# Patient Record
Sex: Male | Born: 1960 | Race: White | Hispanic: No | Marital: Single | Smoking: Never smoker
Health system: Southern US, Community
[De-identification: ages and names within clinical notes are randomized; demographics above are authoritative.]

## PROBLEM LIST (undated history)

## (undated) DIAGNOSIS — R059 Cough, unspecified: Secondary | ICD-10-CM

## (undated) DIAGNOSIS — Z22322 Carrier or suspected carrier of Methicillin resistant Staphylococcus aureus: Secondary | ICD-10-CM

## (undated) DIAGNOSIS — K200 Eosinophilic esophagitis: Secondary | ICD-10-CM

## (undated) DIAGNOSIS — R05 Cough: Secondary | ICD-10-CM

## (undated) DIAGNOSIS — K222 Esophageal obstruction: Secondary | ICD-10-CM

## (undated) HISTORY — DX: Eosinophilic esophagitis: K20.0

## (undated) HISTORY — DX: Cough: R05

## (undated) HISTORY — DX: Cough, unspecified: R05.9

## (undated) HISTORY — DX: Esophageal obstruction: K22.2

## (undated) HISTORY — PX: NASAL POLYP EXCISION: SHX2068

## (undated) HISTORY — DX: Carrier or suspected carrier of methicillin resistant Staphylococcus aureus: Z22.322

---
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
## 2010-01-21 DIAGNOSIS — L0291 Cutaneous abscess, unspecified: Secondary | ICD-10-CM | POA: Insufficient documentation

## 2010-01-21 DIAGNOSIS — L0390 Cellulitis, unspecified: Secondary | ICD-10-CM

## 2010-02-07 ENCOUNTER — Encounter (INDEPENDENT_AMBULATORY_CARE_PROVIDER_SITE_OTHER): Payer: Self-pay | Admitting: *Deleted

## 2010-03-22 ENCOUNTER — Ambulatory Visit: Payer: Self-pay | Admitting: Internal Medicine

## 2010-03-22 DIAGNOSIS — L0293 Carbuncle, unspecified: Secondary | ICD-10-CM

## 2010-03-22 DIAGNOSIS — J309 Allergic rhinitis, unspecified: Secondary | ICD-10-CM | POA: Insufficient documentation

## 2010-03-22 DIAGNOSIS — L0292 Furuncle, unspecified: Secondary | ICD-10-CM | POA: Insufficient documentation

## 2010-03-25 ENCOUNTER — Encounter: Payer: Self-pay | Admitting: Infectious Diseases

## 2010-06-21 NOTE — Miscellaneous (Signed)
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 Added new problem of ALLERGIC RHINITIS CAUSE UNSPECIFIED (ICD-477.9) Medications: Added new medication of BACTRIM DS 800-160 MG TABS (SULFAMETHOXAZOLE-TRIMETHOPRIM) Take 2 tablets by mouth two times a day per Lovenia Kim, PA-C Added new medication of IBUPROFEN 200 MG TABS (IBUPROFEN) Take 1 tablet by mouth every 6 hours as needed per Lovenia Kim, PA-C Added new medication of CLARITIN 10 MG TABS (LORATADINE) Take 1 tablet by mouth once a day as needed per Lovenia Kim, PA-C Added new medication of BACTROBAN NASAL 2 % OINT (MUPIROCIN CALCIUM) Apply nasallly two times a day for 7 days Observations: Added new observation of NKA: T (02/07/2010 8:36)

## 2010-06-21 NOTE — Consult Note (Signed)
Summary: New Pt. Referral: [facility]  New Pt. Referral: [facility]   Imported By: Florinda Marker 03/25/2010 11:20:38  _____________________________________________________________________  External Attachment:    Type:   Image     Comment:   External Document

## 2010-06-21 NOTE — Assessment & Plan Note (Signed)
Summary: refrom 9/28 new pt. [mkj]   CC:  10 times in the past 2 years I have had MRSA.  Starts a "spider-bite" looking place and develops into a "head".  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 Dewaine Oats for evaluation of recurrent MRSA boils.  [name] states that he has generally been in good health until January of 2010 when he developed his first large boil.  He has had 9 separate recurrences since that time with large boils on his abdomen, left thigh, arms, axillae and face.  All have required treatment with oral antibiotics, usually Bactrim.  One and required incision and drainage.  Several have drained spontaneously and been cultured. Several cultures, including one from 9 to 11, grew MRSA.  In addition to antibiotic therapy and one incision and drainage procedure he has also tried bathing with chlorhexidine daily for the past 1 1/2 years, 3 separate rounds of intranasal mupirocin decolonization, and he says he went "overboard for a while" bathing in diluted bleach solutions to try to prevent recurrences.  His last boil occurred on his left forearm near his elbow in early September.  He was treated with Bactrim again but developed a pruritic red rash and was changed to doxycycline.  He has not had any recurrences since that time.  He continues today daily with chlorhexidine.  He admits to having problems with dry skin and suffers frequent cuts and scrapes in his work.  He lives with his girlfriend and her son.  Her son had one lesion similar to a boil on his left foot and was treated empirically for MRSA.  He knows of no other sick contacts.  Other than allergic rhinitis he has very few health problems.  Preventive Screening-Counseling & Management  Alcohol-Tobacco     Alcohol drinks/day: <1     Smoking Status: never  Caffeine-Diet-Exercise     Caffeine use/day: yes     Does Patient Exercise:  no  Safety-Violence-Falls     Seat Belt Use: no     Seat Belt Counseling: to use seat belts when in vehicle   Prior Medication List:  IBUPROFEN 200 MG TABS (IBUPROFEN) Take 1 tablet by mouth every 6 hours as needed per [employer], PA-C CLARITIN 10 MG TABS (LORATADINE) Take 1 tablet by mouth once a day as needed per Lovenia Kim, PA-C   Current Allergies (reviewed today): ! BACTRIM DS (SULFAMETHOXAZOLE-TRIMETHOPRIM) Past History:  Past Medical History: Allergic rhinitis  Past Surgical History: Sinus surgery for nasal polyps x 2 Umbilical herniorrhaphy L zygoma fx, ORIF L knee medial collateral ligamnet repair  Review of Systems  The patient denies anorexia, fever, weight loss, weight gain, and enlarged lymph nodes.    Vital Signs:  Patient profile:   50 year old male Height:      68 inches (172.72 cm) Weight:      252.5 pounds (114.77 kg) BMI:     38.53 Temp:     98.0 degrees F (36.67 degrees C) oral Pulse rate:   77 / minute BP sitting:   151 / 102  (left arm) Cuff size:   large  Vitals Entered By: Jennet Maduro RN (March 22, 2010 11:29 AM) CC: 10 times in the past 2 years I have had MRSA.  Starts a "spider-bite" looking place, develops into a "head" Is Patient Diabetic? No Pain Assessment Patient in pain? no      Nutritional Status  BMI of > 30 = obese  Have you ever been in a relationship where you felt threatened, hurt or afraid?not asked today   Does patient need assistance? Functional Status Self care Ambulation Normal   Physical Exam  General:  alert and overweight-appearing.   Eyes:  vision grossly intact, pupils equal, pupils round, pupils reactive to light, corneas and lenses clear, and no injection.   Nose:  no external deformity, no nasal discharge, and no nasal mucosal lesions.   Mouth:  pharynx pink and moist, no erythema, no exudates, and fair dentition.   Lungs:  normal breath sounds, no crackles, and no wheezes.   Heart:  normal rate,  regular rhythm, and no murmur.   Skin:  he has recent healing cuts on his left thumb and left wrist.  He has no active boils at this time.  He has multiple fading scars where he has had previous boils. he has dry skin around his hands elbows and knees. Cervical Nodes:  no anterior cervical adenopathy and no posterior cervical adenopathy.   Axillary Nodes:  no R axillary adenopathy and no L axillary adenopathy.   Psych:  normally interactive, good eye contact, not anxious appearing, and not depressed appearing.     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 has been struggling with recurrent community-acquired MRSA boils.  Unfortunately there is no click or easy fixed for this problem.  I am afraid that he may be creating more of a problem with daily chlorhexidine bathing causing worsening dry skin and greater susceptibility to infection.  I suggested that he cut back to two to 3 times a week at most and increase use of moisturizing creams.  The cuts he sustains through work may also be an open door for infection.  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  He also ran on the Internet that a woman with similar problems had taken Zyvox and then reported that her boils stopped.  I had a long discussion with him about the lack of post active trials of different therapeutic agents in this situation.  I do not recommend a Zyvox and have counseled him about indiscriminate use of any antibiotic regimen as it is likely to lead to an increased risk of resistance or further problems with tolerance.  I suspect that his rash while on Bactrim is an allergic hypersensitivity reaction.  If he has future boils, I would consider the need for further incision and drainage and culture any drainage to help guide antibiotic therapy.  Going forward doxycycline is  probably the best bet for empiric therapy. Orders: Consultation Level III (04540)  Patient Instructions: 1)  Please schedule a follow-up appointment as needed.

## 2010-06-21 NOTE — Miscellaneous (Signed)
Summary: HIPAA Restrictions  HIPAA Restrictions   Imported By: Florinda Marker 03/23/2010 09:28:16  _____________________________________________________________________  External Attachment:    Type:   Image     Comment:   External Document

## 2010-07-29 ENCOUNTER — Encounter: Payer: Self-pay | Admitting: Licensed Clinical Social Worker

## 2011-06-14 ENCOUNTER — Encounter: Payer: Self-pay | Admitting: Critical Care Medicine

## 2011-06-15 ENCOUNTER — Ambulatory Visit (HOSPITAL_BASED_OUTPATIENT_CLINIC_OR_DEPARTMENT_OTHER)
Admission: RE | Admit: 2011-06-15 | Discharge: 2011-06-15 | Disposition: A | Discharge status: Home / Self Care | Payer: 59 | Source: Ambulatory Visit | Attending: Critical Care Medicine | Admitting: Critical Care Medicine

## 2011-06-15 ENCOUNTER — Ambulatory Visit (INDEPENDENT_AMBULATORY_CARE_PROVIDER_SITE_OTHER): Payer: 59 | Admitting: Critical Care Medicine

## 2011-06-15 ENCOUNTER — Other Ambulatory Visit: Payer: Self-pay | Admitting: Critical Care Medicine

## 2011-06-15 ENCOUNTER — Encounter: Payer: Self-pay | Admitting: Critical Care Medicine

## 2011-06-15 ENCOUNTER — Telehealth: Payer: Self-pay | Admitting: *Deleted

## 2011-06-15 DIAGNOSIS — J45902 Unspecified asthma with status asthmaticus: Secondary | ICD-10-CM

## 2011-06-15 DIAGNOSIS — R059 Cough, unspecified: Secondary | ICD-10-CM

## 2011-06-15 DIAGNOSIS — J3489 Other specified disorders of nose and nasal sinuses: Secondary | ICD-10-CM

## 2011-06-15 DIAGNOSIS — R05 Cough: Secondary | ICD-10-CM

## 2011-06-15 DIAGNOSIS — J45909 Unspecified asthma, uncomplicated: Secondary | ICD-10-CM | POA: Insufficient documentation

## 2011-06-15 DIAGNOSIS — J329 Chronic sinusitis, unspecified: Secondary | ICD-10-CM

## 2011-06-15 DIAGNOSIS — K200 Eosinophilic esophagitis: Secondary | ICD-10-CM

## 2011-06-15 MED ORDER — MOMETASONE FUROATE 220 MCG/INH IN AEPB: 2.0000 | INHALATION_SPRAY | Freq: Every day | RESPIRATORY_TRACT | Status: DC

## 2011-06-15 MED ORDER — TRIAMCINOLONE ACETONIDE(NASAL) 55 MCG/ACT NA INHA: 2.0000 | Freq: Two times a day (BID) | NASAL | Status: DC

## 2011-06-15 NOTE — Telephone Encounter (Signed)
I called pt's number - [phone] - [email or web address]

## 2011-06-15 NOTE — Progress Notes (Signed)
Subjective:    Patient ID: Michael Schaefer, male    DOB: November 18, 1960, 51 y.o.   MRN: [id number]  HPI Comments: Chronic cough and chest congestion is chief complaint  For 90day duration but may come on before for 9 months , wax and wanes.  Cough This is a recurrent problem. The current episode started more than 1 month ago. The problem has been waxing and waning (currently is better than before). Episode frequency: Now: once an hour, before was every 5-23min; previous episodes 5-6 over 9months. The cough is non-productive (Now is dry, was before productive of green yellow and thick jelly like). Associated symptoms include chest pain, headaches, postnasal drip, rhinorrhea, a sore throat, shortness of breath and wheezing. Pertinent negatives include no chills, ear congestion, ear pain, fever, heartburn, hemoptysis, myalgias, nasal congestion or rash. Associated symptoms comments: Had chest tightness, pain with severe cough paroxysms. The symptoms are aggravated by lying down, exercise and stress (symptoms worse at night). 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 prior nasal polyps have been removed.  Many years of allergy immunotherapy; [place] ) for the symptoms. The treatment provided mild (inhalers make the cough more productive but no help for the cough,  ABX do not help the cough,  prednisone has helped ) relief. His past medical history is significant for environmental allergies and pneumonia. There is no history of asthma, bronchiectasis, bronchitis, COPD or emphysema. coughing while living in [place].  Lived there lifelong except for past 1.22yrs.      Past Medical History  Diagnosis Date  . MRSA (methicillin resistant staph aureus) culture positive     x 9  . Allergic rhinitis   . Schatzki's ring   . Eosinophilic esophagitis   . Cough       Family History  Problem Relation Age of Onset  . COPD Mother   . Uterine cancer Mother   . Asthma Daughter      History   Social History  . Marital Status: Single    Spouse Name: N/A    Number of Children: N/A  . Years of Education: N/A   Occupational History  . Not on file.   Social History Main Topics  . Smoking status: Never Smoker   . Smokeless tobacco: Never Used  . Alcohol Use: Yes     occasional wine  . Drug Use: No  . Sexually Active: Not on file   Other Topics Concern  . Not on file   Social History Narrative  . No narrative on file     Allergies  Allergen Reactions  . Sulfamethoxazole W/Trimethoprim     REACTION: pruritic rash     Outpatient Prescriptions Prior to Visit  Medication Sig Dispense Refill  . albuterol (PROVENTIL HFA) 108 (90 BASE) MCG/ACT inhaler Inhale 2 puffs into the lungs every 6 (six) hours as needed.      . hydrochlorothiazide (HYDRODIURIL) 25 MG tablet Take 25 mg by mouth daily.      Marland Kitchen ibuprofen (ADVIL,MOTRIN) 200 MG tablet Take 200 mg by mouth every 6 (six) hours as needed.        . traMADol (ULTRAM) 50 MG tablet Take 50 mg by mouth every 6 (six) hours as needed.      . triamcinolone (NASACORT AQ) 55 MCG/ACT nasal inhaler Place 1 spray into the nose as needed.       Marland Kitchen  cetirizine (ZYRTEC) 10 MG tablet Take 10 mg by mouth daily. Alternates with allegra and claritin      . loratadine (CLARITIN) 10 MG tablet Take 10 mg by mouth daily. Alternates with zyrtec and allegra      . budesonide-formoterol (SYMBICORT) 160-4.5 MCG/ACT inhaler Inhale 2 puffs into the lungs as needed.       . fexofenadine-pseudoephedrine (ALLEGRA-D) 60-120 MG per tablet Take 1 tablet by mouth. Alternate as directed          Review of Systems  Constitutional: Positive for fatigue. Negative for fever, chills, diaphoresis, activity change, appetite change and unexpected weight change.  HENT: Positive for congestion, sore throat, rhinorrhea, sneezing, trouble  swallowing, voice change, postnasal drip, sinus pressure and tinnitus. Negative for hearing loss, ear pain, nosebleeds, facial swelling, mouth sores, neck pain, neck stiffness, dental problem and ear discharge.   Eyes: Positive for itching. Negative for photophobia, discharge and visual disturbance.  Respiratory: Positive for cough, chest tightness, shortness of breath and wheezing. Negative for apnea, hemoptysis, choking and stridor.   Cardiovascular: Positive for chest pain. Negative for palpitations and leg swelling.  Gastrointestinal: Negative for heartburn, nausea, vomiting, abdominal pain, constipation, blood in stool and abdominal distention.  Genitourinary: Positive for flank pain. Negative for dysuria, urgency, frequency, hematuria, decreased urine volume and difficulty urinating.  Musculoskeletal: Positive for back pain and gait problem. Negative for myalgias, joint swelling and arthralgias.  Skin: Positive for pallor. Negative for color change and rash.  Neurological: Positive for dizziness, weakness, light-headedness and headaches. Negative for tremors, seizures, syncope, speech difficulty and numbness.  Hematological: Positive for environmental allergies. Negative for adenopathy. Does not bruise/bleed easily.  Psychiatric/Behavioral: Positive for sleep disturbance. Negative for confusion and agitation. The patient is not nervous/anxious.        Objective:   Physical Exam Filed Vitals:   06/15/11 0956  BP: 148/106  Pulse: 80  Temp: 98.5 F (36.9 C)  TempSrc: Oral  Height: 5\' 8"  (1.727 m)  Weight: 255 lb (115.667 kg)  SpO2: 92%    Gen: Pleasant, well-nourished, in no distress,  normal affect  ENT: No lesions,  mouth clear,  oropharynx clear, +++ postnasal drip, nasal purulence  Neck: No JVD, no TMG, no carotid bruits  Lungs: No use of accessory muscles, no dullness to percussion, distant BS.    Cardiovascular: RRR, heart sounds normal, no murmur or gallops, no  peripheral edema  Abdomen: soft and NT, no HSM,  BS normal  Musculoskeletal: No deformities, no cyanosis or clubbing  Neuro: alert, non focal  Skin: Warm, no lesions or rashes  Ct Maxillofacial Ltd Wo Cm  06/15/2011  *RADIOLOGY REPORT*  Clinical Data:  51 year old male with drainage, congestion, prior sinus surgery.  CT LIMITED SINUSES WITHOUT CONTRAST  Technique:  Multidetector CT images of the paranasal sinuses were obtained in a single plane without contrast.  Comparison:  None.  Findings:  Negative visualized noncontrast brain parenchyma. Visualized orbit soft tissues are within normal limits.  Negative visualized face soft tissues.  Visualized mastoid air cells and tympanic cavities are clear.  Fluid levels and bubbly opacity in both sphenoid sinuses. Sequelae of maxillary antrostomies with bilateral circumferential maxillary mucosal thickening.  Trace bubbly opacity on the left. Status post at least partial ethmoidectomies with mild residual ethmoid mucosal thickening. Both frontal sinuses are opacified.  There are fluid levels and bubbly opacity in both frontal sinuses.  Leftward nasal septal deviation. No acute osseous abnormality identified.  Small cerclage wire at the left  inferior margin of the bony orbits and maxilla.  IMPRESSION: Previous maxillary antrostomies and at least partial ethmoidectomies with pansinus inflammatory change including fluid levels and bubbly opacity compatible with acute sinusitis.  Original Report Authenticated By: Harley Hallmark, M.D.          Assessment & Plan:   Cough Cyclical cough on the basis of chronic sinusitis, poss hyperreactive airway disease, upper  airway instability, GERD,  Note CT sinus shows severe sinus disease. Plan Stop symbicort Start Asmanex two puff daily Increase nasacort two puff daily Start augmentin 875mg  bid x 10days Nasal rinse Check Allergy profile     Updated Medication List Outpatient Encounter Prescriptions as of  06/15/2011  Medication Sig Dispense Refill  . albuterol (PROVENTIL HFA) 108 (90 BASE) MCG/ACT inhaler Inhale 2 puffs into the lungs every 6 (six) hours as needed.      . fexofenadine (ALLEGRA) 180 MG tablet Take 180 mg by mouth daily. Alternates with zyrtec and claritin      . hydrochlorothiazide (HYDRODIURIL) 25 MG tablet Take 25 mg by mouth daily.      Marland Kitchen ibuprofen (ADVIL,MOTRIN) 200 MG tablet Take 200 mg by mouth every 6 (six) hours as needed.        . traMADol (ULTRAM) 50 MG tablet Take 50 mg by mouth every 6 (six) hours as needed.      . triamcinolone (NASACORT AQ) 55 MCG/ACT nasal inhaler Place 2 sprays into the nose 2 (two) times daily.  1 Inhaler  6  . DISCONTD: triamcinolone (NASACORT AQ) 55 MCG/ACT nasal inhaler Place 1 spray into the nose as needed.       . cetirizine (ZYRTEC) 10 MG tablet Take 10 mg by mouth daily. Alternates with allegra and claritin      . loratadine (CLARITIN) 10 MG tablet Take 10 mg by mouth daily. Alternates with zyrtec and allegra      . mometasone (ASMANEX 120 METERED DOSES) 220 MCG/INH inhaler Inhale 2 puffs into the lungs daily.  1 Inhaler  12  . DISCONTD: budesonide-formoterol (SYMBICORT) 160-4.5 MCG/ACT inhaler Inhale 2 puffs into the lungs as needed.       Marland Kitchen DISCONTD: fexofenadine-pseudoephedrine (ALLEGRA-D) 60-120 MG per tablet Take 1 tablet by mouth. Alternate as directed

## 2011-06-15 NOTE — Patient Instructions (Signed)
Blood tests today  Stop symbicort Start Asmanex two puff daily Increase nasacort two puff daily CT scan of sinus will be obtained Return 6 weeks

## 2011-06-15 NOTE — Telephone Encounter (Signed)
Pt was on nasacort before by report but I am ok with flonase two puff daily

## 2011-06-16 ENCOUNTER — Telehealth: Payer: Self-pay | Admitting: Critical Care Medicine

## 2011-06-16 MED ORDER — AMOXICILLIN-POT CLAVULANATE 875-125 MG PO TABS: 1.0000 | ORAL_TABLET | Freq: Two times a day (BID) | ORAL | Status: AC

## 2011-06-16 NOTE — Telephone Encounter (Signed)
Please call pt and tell him I sent an antibiotic to his pharmacy for sinusitis.

## 2011-06-16 NOTE — Telephone Encounter (Signed)
LMOMTCB x 1 

## 2011-06-16 NOTE — Assessment & Plan Note (Addendum)
Cyclical cough on the basis of chronic sinusitis, poss hyperreactive airway disease, upper  airway instability, GERD,  Note CT sinus shows severe sinus disease. Plan Stop symbicort Start Asmanex two puff daily Increase nasacort two puff daily Start augmentin 875mg  bid x 10days Nasal rinse Check Allergy profile

## 2011-06-19 LAB — ALLERGY FULL PROFILE
Allergen, D pternoyssinus,d7: <0.1 kU/L (ref <0.35)
Allergen,Goose feathers, e70: <0.1 kU/L (ref <0.35)
Bermuda Grass: 2.79 kU/L — ABNORMAL HIGH (ref <0.35)
Box Elder IgE: 0.81 kU/L — ABNORMAL HIGH (ref <0.35)
Candida Albicans: <0.1 kU/L (ref <0.35)
G005 Rye, Perennial: 2.44 kU/L — ABNORMAL HIGH (ref <0.35)
G009 Red Top: 2.86 kU/L — ABNORMAL HIGH (ref <0.35)
IgE (Immunoglobulin E), Serum: 74.9 IU/mL (ref 0.0–180.0)
Oak: 0.96 kU/L — ABNORMAL HIGH (ref <0.35)
Stemphylium Botryosum: <0.1 kU/L (ref <0.35)
Timothy Grass: 2.03 kU/L — ABNORMAL HIGH (ref <0.35)

## 2011-06-19 MED ORDER — FLUTICASONE PROPIONATE 50 MCG/ACT NA SUSP: 2.0000 | Freq: Every day | NASAL | Status: DC

## 2011-06-19 NOTE — Telephone Encounter (Signed)
Called, spoke with pt.  Advised insurance will not cover nasacort but will cover flonase.  Pt ok with flonase rx.  Advised rx will be sent to [facility] for flonase - take 2 puffs daily.  He verbalized understanding of this.  [employer], spoke with [place]. Advised ok to change nasacort to [facility] 2 puffs daily #1 x 6.  She verbalized understanding of this.

## 2011-06-19 NOTE — Telephone Encounter (Signed)
Pt returned Crystal's call & can be reached at [phone].  Antionette Fairy

## 2011-06-19 NOTE — Telephone Encounter (Signed)
[email or web address]  

## 2011-06-19 NOTE — Telephone Encounter (Signed)
Advised pt of antibiotic called into his pharmacy for sinusitis.  Pt stated nothing further needed.   Antionette Fairy

## 2011-06-21 LAB — HYPERSENSITIVITY PNUEMONITIS PROFILE

## 2011-06-21 LAB — FUNGAL ANTIBODIES PANEL, ID-BLOOD
Aspergillus Flavus Antibodies: NEGATIVE
Coccidioides Antibody ID: NEGATIVE

## 2011-06-22 ENCOUNTER — Institutional Professional Consult (permissible substitution): Payer: Self-pay | Admitting: Internal Medicine

## 2011-07-20 ENCOUNTER — Encounter: Payer: Self-pay | Admitting: Critical Care Medicine

## 2011-07-20 ENCOUNTER — Ambulatory Visit (INDEPENDENT_AMBULATORY_CARE_PROVIDER_SITE_OTHER): Payer: 59 | Admitting: Critical Care Medicine

## 2011-07-20 VITALS — BP 142/98 | HR 62 | Temp 98.1°F | Ht 68.0 in | Wt 246.5 lb

## 2011-07-20 DIAGNOSIS — R059 Cough, unspecified: Secondary | ICD-10-CM

## 2011-07-20 DIAGNOSIS — R05 Cough: Secondary | ICD-10-CM

## 2011-07-20 MED ORDER — PREDNISONE 10 MG PO TABS: ORAL_TABLET | ORAL | Status: DC

## 2011-07-20 MED ORDER — OMALIZUMAB 150 MG ~~LOC~~ SOLR: 300.0000 mg | SUBCUTANEOUS | Status: DC

## 2011-07-20 NOTE — Assessment & Plan Note (Addendum)
Cyclic cough d/t lower airway inflammation with moderate persistent asthma with severe atopy IgE 74 , mulitple positive allergies on RAST testing  Plan Start Xolair Rx 300mg  qmonth Cont inhaled meds Repulse prednisone

## 2011-07-20 NOTE — Progress Notes (Signed)
Subjective:    Patient ID: Michael Schaefer, male    DOB: 26-Oct-1960, 51 y.o.   MRN: [id number]  HPI 07/20/2011 At last ov we rec: Stop symbicort Start Asmanex two puff daily Increase nasacort two puff daily Start augmentin 875mg  bid x 10days Nasal rinse Check Allergy profile  Cough cycles.  Now coughing up greenish yellow,  Not as much volume as before.   Now coughing the worse when first awakens .   Past Medical History  Diagnosis Date  . MRSA (methicillin resistant staph aureus) culture positive     x 9  . Allergic rhinitis   . Schatzki's ring   . Eosinophilic esophagitis   . Cough      Family History  Problem Relation Age of Onset  . COPD Mother   . Uterine cancer Mother   . Asthma Daughter      History   Social History  . Marital Status: Single    Spouse Name: N/A    Number of Children: N/A  . Years of Education: N/A   Occupational History  . Not on file.   Social History Main Topics  . Smoking status: Never Smoker   . Smokeless tobacco: Never Used  . Alcohol Use: Yes     occasional wine  . Drug Use: No  . Sexually Active: Not on file   Other Topics Concern  . Not on file   Social History Narrative  . No narrative on file     Allergies  Allergen Reactions  . Sulfamethoxazole W/Trimethoprim     REACTION: pruritic rash     Outpatient Prescriptions Prior to Visit  Medication Sig Dispense Refill  . albuterol (PROVENTIL HFA) 108 (90 BASE) MCG/ACT inhaler Inhale 2 puffs into the lungs every 6 (six) hours as needed.      . cetirizine (ZYRTEC) 10 MG tablet Take 10 mg by mouth daily. Alternates with allegra and claritin      . fexofenadine (ALLEGRA) 180 MG tablet Take 180 mg by mouth daily. Alternates with zyrtec and claritin      . fluticasone (FLONASE) 50 MCG/ACT nasal spray Place 2 sprays into the nose daily.  1 g  6  . hydrochlorothiazide (HYDRODIURIL) 25 MG tablet Take 25 mg by mouth daily.      Marland Kitchen ibuprofen (ADVIL,MOTRIN) 200 MG tablet Take 200  mg by mouth every 6 (six) hours as needed.        . loratadine (CLARITIN) 10 MG tablet Take 10 mg by mouth daily. Alternates with zyrtec and allegra      . mometasone (ASMANEX 120 METERED DOSES) 220 MCG/INH inhaler Inhale 2 puffs into the lungs daily.  1 Inhaler  12  . traMADol (ULTRAM) 50 MG tablet Take 50 mg by mouth every 6 (six) hours as needed.          Review of Systems  Constitutional: Positive for fatigue. Negative for diaphoresis, activity change, appetite change and unexpected weight change.  HENT: Positive for congestion, sneezing, trouble swallowing, voice change, sinus pressure and tinnitus. Negative for hearing loss, nosebleeds, facial swelling, mouth sores, neck pain, neck stiffness, dental problem and ear discharge.   Eyes: Positive for itching. Negative for photophobia, discharge and visual disturbance.  Respiratory: Positive for chest tightness. Negative for apnea, choking and stridor.   Cardiovascular: Negative for palpitations and leg swelling.  Gastrointestinal: Negative for nausea, vomiting, abdominal pain, constipation, blood in stool and abdominal distention.  Genitourinary: Positive for flank pain. Negative for dysuria, urgency, frequency,  hematuria, decreased urine volume and difficulty urinating.  Musculoskeletal: Positive for back pain and gait problem. Negative for joint swelling and arthralgias.  Skin: Positive for pallor. Negative for color change.  Neurological: Positive for dizziness, weakness and light-headedness. Negative for tremors, seizures, syncope, speech difficulty and numbness.  Hematological: Negative for adenopathy. Does not bruise/bleed easily.  Psychiatric/Behavioral: Positive for sleep disturbance. Negative for confusion and agitation. The patient is not nervous/anxious.        Objective:   Physical Exam  Filed Vitals:   07/20/11 0944  BP: 142/98  Pulse: 62  Temp: 98.1 F (36.7 C)  TempSrc: Oral  Height: 5\' 8"  (1.727 m)  Weight: 246 lb  8 oz (111.812 kg)  SpO2: 96%    Gen: Pleasant, well-nourished, in no distress,  normal affect  ENT: No lesions,  mouth clear,  oropharynx clear, +++ postnasal drip,   Neck: No JVD, no TMG, no carotid bruits  Lungs: No use of accessory muscles, no dullness to percussion, distant BS.    Cardiovascular: RRR, heart sounds normal, no murmur or gallops, no peripheral edema  Abdomen: soft and NT, no HSM,  BS normal  Musculoskeletal: No deformities, no cyanosis or clubbing  Neuro: alert, non focal  Skin: Warm, no lesions or rashes          Assessment & Plan:   Extrinsic asthma with status asthmaticus Cyclic cough d/t lower airway inflammation with moderate persistent asthma with severe atopy IgE 74 , mulitple positive allergies on RAST testing  Plan Start Xolair Rx 300mg  qmonth Cont inhaled meds Repulse prednisone       Updated Medication List Outpatient Encounter Prescriptions as of 07/20/2011  Medication Sig Dispense Refill  . albuterol (PROVENTIL HFA) 108 (90 BASE) MCG/ACT inhaler Inhale 2 puffs into the lungs every 6 (six) hours as needed.      . cetirizine (ZYRTEC) 10 MG tablet Take 10 mg by mouth daily. Alternates with allegra and claritin      . fexofenadine (ALLEGRA) 180 MG tablet Take 180 mg by mouth daily. Alternates with zyrtec and claritin      . fluticasone (FLONASE) 50 MCG/ACT nasal spray Place 2 sprays into the nose daily.  1 g  6  . hydrochlorothiazide (HYDRODIURIL) 25 MG tablet Take 25 mg by mouth daily.      Marland Kitchen ibuprofen (ADVIL,MOTRIN) 200 MG tablet Take 200 mg by mouth every 6 (six) hours as needed.        . loratadine (CLARITIN) 10 MG tablet Take 10 mg by mouth daily. Alternates with zyrtec and allegra      . mometasone (ASMANEX 120 METERED DOSES) 220 MCG/INH inhaler Inhale 2 puffs into the lungs daily.  1 Inhaler  12  . omalizumab (XOLAIR) 150 MG injection Inject 300 mg into the skin every 28 (twenty-eight) days.  2 each  6  . predniSONE (DELTASONE)  10 MG tablet Take 4 for three days 3 for three days 2 for three days 1 for three days and stop  30 tablet  0  . traMADol (ULTRAM) 50 MG tablet Take 50 mg by mouth every 6 (six) hours as needed.

## 2011-07-20 NOTE — Patient Instructions (Signed)
We will look into xolair treatment for you Prednisone Take 4 for three days 3 for three days 2 for three days 1 for three days and stop Stay on inhaled medications  Return 2 months

## 2011-08-04 ENCOUNTER — Telehealth: Payer: Self-pay | Admitting: Critical Care Medicine

## 2011-08-04 NOTE — Telephone Encounter (Signed)
Received copies from Tami Ribas ,on 08/04/11 . Forwarded 4  pages to Dr Delford Field. ,for review.

## 2011-09-15 ENCOUNTER — Telehealth: Payer: Self-pay | Admitting: *Deleted

## 2011-09-15 NOTE — Telephone Encounter (Signed)
Received fax from [facility] stating they have been unsuccessful in contacting pt to obtain consent to bill for xolair.  Without pt consent, they are unable to ship the medication to our office for administration.  Per Fax, if the member is to continue with the prescribed coarse of therapy, he should contact [facility] at [phone] M-F between 5 am and 5 pm PST.    Spoke with Tammy D regarding this - we need to find out if pt has started xolair yet and attempt to contact him.   I spoke with Florentina Addison in Allergy who states pt has not started xolair yet.  I do not see any pending appts for this either.  I attempted to call him - [email or web address] - to discuss.    Note:  He does have a pending OV with Dr. Delford Field on May 2 in [place].  Will hold on to letter and have PW discuss with him then if unable to contact pt in the meantime.

## 2011-09-19 NOTE — Telephone Encounter (Signed)
Called pt at [phone] - [email or web address]

## 2011-09-19 NOTE — Telephone Encounter (Signed)
Pt returned my call.  States he has contacted [facility] twice.  States the second time he was told the Michael Schaefer will be approx $1800 per month and he would be responsible for 20% of this cost per month. His insurance will pay 80% per month.  Pt states if this is the case, he cannot afford this.  As Michael Schaefer has recently taken over xolair, advised pt I would discuss this with her and we would call him back.  Also, he is aware of his pending OV with PW on 09/21/11 in [facility]  [place], can you pls help with this?  Thank you.

## 2011-09-20 NOTE — Telephone Encounter (Signed)
SPOKE TO DAVID FROM [employer] TOLD HIM ABOUT THE PATIENT BEING CONCERNED ABOUT HAVING TO PAY THE 20%  AND HE SAID THERE ARE SOME OPTIONS THAT THE PATIENT CAN LOOK AT ONE BEING A COPAY CARD THAT PAYS UP TO 80% UP TO 4000. A YEAR WHICH EQUALS AROUND 72 A MONTH OR THEY HAVE [employer] THAT IS A GRANT THAT CAN HELP WITH THE COST THEY ARE GOING TO CONTACT THE PATIENT AND GIVE HIM THESE OPTIONS TO SEE IF THAT WOULD HELP HIM.  THEN THEY WILL CONTACT THE OFFICE WITH THE RESULTS OF WHAT THEY HAVE DONE

## 2011-09-21 ENCOUNTER — Ambulatory Visit (INDEPENDENT_AMBULATORY_CARE_PROVIDER_SITE_OTHER): Payer: 59 | Admitting: Critical Care Medicine

## 2011-09-21 ENCOUNTER — Encounter: Payer: Self-pay | Admitting: Critical Care Medicine

## 2011-09-21 VITALS — BP 142/88 | HR 72 | Temp 97.4°F | Ht 68.0 in | Wt 247.0 lb

## 2011-09-21 DIAGNOSIS — J45902 Unspecified asthma with status asthmaticus: Secondary | ICD-10-CM

## 2011-09-21 NOTE — Patient Instructions (Signed)
Stay on fluticasone nasal spray daily Use an antihistamine daily : allegra, clariten, zyrtec are all ok Stay on asmanex daily We will hold off on Xolair for now due to its cost to you Return 4 months

## 2011-09-21 NOTE — Progress Notes (Signed)
Subjective:    Patient ID: Michael Schaefer, male    DOB: 18-Feb-1961, 51 y.o.   MRN: [id number]  HPI  2/28 At last ov we rec: Stop symbicort Start Asmanex two puff daily Increase nasacort two puff daily Start augmentin 875mg  bid x 10days Nasal rinse Check Allergy profile  Cough cycles.  Now coughing up greenish yellow,  Not as much volume as before.   Now coughing the worse when first awakens .   09/21/2011 Still gets allergic reactions, dyspnea is ok, just coughing.  If stays out of environments and takes allegra is ok. Cannot afford copay for xolair  Pt denies any significant sore throat, nasal congestion or excess secretions, fever, chills, sweats, unintended weight loss, pleurtic or exertional chest pain, orthopnea PND, or leg swelling Pt denies any increase in rescue therapy over baseline, denies waking up needing it or having any early am or nocturnal exacerbations of coughing/wheezing/or dyspnea. Pt also denies any obvious fluctuation in symptoms with  weather or environmental change or other alleviating or aggravating factors  PUL ASTHMA HISTORY 09/22/2011  Symptoms 0-2 days/week  Nighttime awakenings 0-2/month  Interference with activity No limitations  SABA use 0-2 days/wk  Exacerbations requiring oral steroids 0-1 / year     Past Medical History  Diagnosis Date  . MRSA (methicillin resistant staph aureus) culture positive     x 9  . Allergic rhinitis   . Schatzki's ring   . Eosinophilic esophagitis   . Cough      Family History  Problem Relation Age of Onset  . COPD Mother   . Uterine cancer Mother   . Asthma Daughter      History   Social History  . Marital Status: Single    Spouse Name: N/A    Number of Children: N/A  . Years of Education: N/A   Occupational History  . Not on file.   Social History Main Topics  . Smoking status: Never Smoker   . Smokeless tobacco: Never Used  . Alcohol Use: Yes     occasional wine  . Drug Use: No  . Sexually  Active: Not on file   Other Topics Concern  . Not on file   Social History Narrative  . No narrative on file     Allergies  Allergen Reactions  . Sulfamethoxazole W-Trimethoprim     REACTION: pruritic rash     Outpatient Prescriptions Prior to Visit  Medication Sig Dispense Refill  . albuterol (PROVENTIL HFA) 108 (90 BASE) MCG/ACT inhaler Inhale 2 puffs into the lungs every 6 (six) hours as needed.      . fluticasone (FLONASE) 50 MCG/ACT nasal spray Place 2 sprays into the nose daily.  1 g  6  . ibuprofen (ADVIL,MOTRIN) 200 MG tablet Take 200 mg by mouth every 6 (six) hours as needed.        . loratadine (CLARITIN) 10 MG tablet Take 10 mg by mouth daily. Alternates with zyrtec and allegra      . mometasone (ASMANEX 120 METERED DOSES) 220 MCG/INH inhaler Inhale 2 puffs into the lungs daily.  1 Inhaler  12  . cetirizine (ZYRTEC) 10 MG tablet Take 10 mg by mouth daily. Alternates with allegra and claritin      . fexofenadine (ALLEGRA) 180 MG tablet Take 180 mg by mouth daily. Alternates with zyrtec and claritin      . hydrochlorothiazide (HYDRODIURIL) 25 MG tablet Take 25 mg by mouth daily.      Marland Kitchen  omalizumab (XOLAIR) 150 MG injection Inject 300 mg into the skin every 28 (twenty-eight) days.  2 each  6  . predniSONE (DELTASONE) 10 MG tablet Take 4 for three days 3 for three days 2 for three days 1 for three days and stop  30 tablet  0  . traMADol (ULTRAM) 50 MG tablet Take 50 mg by mouth every 6 (six) hours as needed.          Review of Systems  Constitutional: Positive for fatigue. Negative for diaphoresis, activity change, appetite change and unexpected weight change.  HENT: Positive for congestion, sneezing, trouble swallowing, voice change, sinus pressure and tinnitus. Negative for hearing loss, nosebleeds, facial swelling, mouth sores, neck pain, neck stiffness, dental problem and ear discharge.   Eyes: Positive for itching. Negative for photophobia, discharge and visual  disturbance.  Respiratory: Positive for chest tightness. Negative for apnea, choking and stridor.   Cardiovascular: Negative for palpitations and leg swelling.  Gastrointestinal: Negative for nausea, vomiting, abdominal pain, constipation, blood in stool and abdominal distention.  Genitourinary: Positive for flank pain. Negative for dysuria, urgency, frequency, hematuria, decreased urine volume and difficulty urinating.  Musculoskeletal: Positive for back pain and gait problem. Negative for joint swelling and arthralgias.  Skin: Positive for pallor. Negative for color change.  Neurological: Positive for dizziness, weakness and light-headedness. Negative for tremors, seizures, syncope, speech difficulty and numbness.  Hematological: Negative for adenopathy. Does not bruise/bleed easily.  Psychiatric/Behavioral: Positive for sleep disturbance. Negative for confusion and agitation. The patient is not nervous/anxious.        Objective:   Physical Exam  Filed Vitals:   09/21/11 1642  BP: 142/88  Pulse: 72  Temp: 97.4 F (36.3 C)  TempSrc: Oral  Height: 5\' 8"  (1.727 m)  Weight: 112.038 kg (247 lb)  SpO2: 97%    Gen: Pleasant, well-nourished, in no distress,  normal affect  ENT: No lesions,  mouth clear,  oropharynx clear, + postnasal drip,   Neck: No JVD, no TMG, no carotid bruits  Lungs: No use of accessory muscles, no dullness to percussion, distant BS.    Cardiovascular: RRR, heart sounds normal, no murmur or gallops, no peripheral edema  Abdomen: soft and NT, no HSM,  BS normal  Musculoskeletal: No deformities, no cyanosis or clubbing  Neuro: alert, non focal  Skin: Warm, no lesions or rashes          Assessment & Plan:   Extrinsic asthma with status asthmaticus Cyclical cough d/t extrinsic asthma with atopic features Pt canno afford Xolair so will d/c this effort Plan Stay on fluticasone nasal spray daily Use an antihistamine daily : allegra, clariten, zyrtec  are all ok Stay on asmanex daily We will hold off on Xolair for now due to its cost to you Return 4 months      Updated Medication List Outpatient Encounter Prescriptions as of 09/21/2011  Medication Sig Dispense Refill  . albuterol (PROVENTIL HFA) 108 (90 BASE) MCG/ACT inhaler Inhale 2 puffs into the lungs every 6 (six) hours as needed.      . fluticasone (FLONASE) 50 MCG/ACT nasal spray Place 2 sprays into the nose daily.  1 g  6  . ibuprofen (ADVIL,MOTRIN) 200 MG tablet Take 200 mg by mouth every 6 (six) hours as needed.        . loratadine (CLARITIN) 10 MG tablet Take 10 mg by mouth daily. Alternates with zyrtec and allegra      . mometasone (ASMANEX 120 METERED DOSES) 220  MCG/INH inhaler Inhale 2 puffs into the lungs daily.  1 Inhaler  12  . pseudoephedrine (SUDAFED) 120 MG 12 hr tablet Take 120 mg by mouth as needed.      . terbinafine (LAMISIL) 250 MG tablet Take 1 tablet by mouth daily.      . cetirizine (ZYRTEC) 10 MG tablet Take 10 mg by mouth daily. Alternates with allegra and claritin      . fexofenadine (ALLEGRA) 180 MG tablet Take 180 mg by mouth daily. Alternates with zyrtec and claritin      . hydrochlorothiazide (HYDRODIURIL) 25 MG tablet Take 25 mg by mouth daily.      Marland Kitchen DISCONTD: omalizumab (XOLAIR) 150 MG injection Inject 300 mg into the skin every 28 (twenty-eight) days.  2 each  6  . DISCONTD: predniSONE (DELTASONE) 10 MG tablet Take 4 for three days 3 for three days 2 for three days 1 for three days and stop  30 tablet  0  . DISCONTD: traMADol (ULTRAM) 50 MG tablet Take 50 mg by mouth every 6 (six) hours as needed.

## 2011-09-22 NOTE — Assessment & Plan Note (Signed)
Cyclical cough d/t extrinsic asthma with atopic features Pt canno afford Xolair so will d/c this effort Plan Stay on fluticasone nasal spray daily Use an antihistamine daily : allegra, clariten, zyrtec are all ok Stay on asmanex daily We will hold off on Xolair for now due to its cost to you Return 4 months

## 2013-12-18 ENCOUNTER — Encounter: Payer: Self-pay | Admitting: Internal Medicine

## 2017-06-21 DIAGNOSIS — J45909 Unspecified asthma, uncomplicated: Secondary | ICD-10-CM | POA: Diagnosis not present

## 2017-06-21 DIAGNOSIS — R0989 Other specified symptoms and signs involving the circulatory and respiratory systems: Secondary | ICD-10-CM | POA: Diagnosis not present

## 2017-08-27 ENCOUNTER — Encounter: Payer: Self-pay | Admitting: Emergency Medicine

## 2017-08-27 ENCOUNTER — Ambulatory Visit (INDEPENDENT_AMBULATORY_CARE_PROVIDER_SITE_OTHER): Payer: BLUE CROSS/BLUE SHIELD | Admitting: Emergency Medicine

## 2017-08-27 VITALS — BP 168/98 | HR 74 | Ht 68.0 in | Wt 246.0 lb

## 2017-08-27 DIAGNOSIS — J4540 Moderate persistent asthma, uncomplicated: Secondary | ICD-10-CM | POA: Diagnosis not present

## 2017-08-27 DIAGNOSIS — J301 Allergic rhinitis due to pollen: Secondary | ICD-10-CM

## 2017-08-27 DIAGNOSIS — J45902 Unspecified asthma with status asthmaticus: Secondary | ICD-10-CM

## 2017-08-27 NOTE — Assessment & Plan Note (Signed)
Alternates the non-sedating anti-histamines. May need nasal steroid added back although the nasal sx are not prominent.  Suspect he would benefit from referral back for immunotherapy.  May benefit from Singulair as above.  We will decide which regimen to focus on once he has has pulmonary function testing and chest x-ray.

## 2017-08-27 NOTE — Patient Instructions (Signed)
Chest x-ray today We will perform pulmonary function testing at your next office visit. Please continue to use your daily allergy pill (loratadine, Zyrtec or Allegra) You can continue to use Sudafed as you needed Keep albuterol available to use 2 puffs if needed for chest tightness, cough, mucus clearance Follow with Dr Delton CoombesByrum next available with full PFT

## 2017-08-27 NOTE — Progress Notes (Signed)
Subjective:    Patient ID: Michael Schaefer, male    DOB: 12-Oct-1960, 57 y.o.   MRN: [id number]  HPI 57 year old never smoker previously followed by Dr. Delford FieldWright in our office.  He has a history of allergic rhinitis formerly on immunotherapy, eosinophilic esophagitis with a Schatzki's ring, chronic cough and asthma.  He has been treated in the past with Asmanex and Symbicort, multiple other scheduled meds given as samples by his PCP.  He currently is using albuterol only as needed. He uses approximately 1-2x a week.   He describes symptoms of intermittent cough and wheeze, minimal dyspnea, has been treated on multiple occasions with prednisone to quiet cough over the last 3-4 yrs. No significant nasal congestion or rhinorrhea. He then has congestion returns and leads to cough with clear mucous that progresses in thickness, has a greenish yellow color. He alternates loratadine, alternated w zyrtec and allegra. Uses sudafed most days as well. His last pred was late February.    Review of Systems  Constitutional: Negative for fever and unexpected weight change.  HENT: Negative for congestion, dental problem, ear pain, nosebleeds, postnasal drip, rhinorrhea, sinus pressure, sneezing, sore throat and trouble swallowing.   Eyes: Negative for redness and itching.  Respiratory: Negative for cough, chest tightness, shortness of breath and wheezing.   Cardiovascular: Negative for palpitations and leg swelling.  Gastrointestinal: Negative for nausea and vomiting.  Genitourinary: Negative for dysuria.  Musculoskeletal: Negative for joint swelling.  Skin: Negative for rash.  Neurological: Negative for headaches.  Hematological: Does not bruise/bleed easily.  Psychiatric/Behavioral: Negative for dysphoric mood. The patient is not nervous/anxious.     Past Medical History:  Diagnosis Date  . Allergic rhinitis   . Cough   . Eosinophilic esophagitis   . MRSA (methicillin resistant staph aureus) culture  positive    x 9  . Schatzki's ring      Family History  Problem Relation Age of Onset  . COPD Mother   . Uterine cancer Mother   . Asthma Daughter      Social History   Socioeconomic History  . Marital status: Single    Spouse name: Not on file  . Number of children: Not on file  . Years of education: Not on file  . Highest education level: Not on file  Occupational History  . Not on file  Social Needs  . Financial resource strain: Not on file  . Food insecurity:    Worry: Not on file    Inability: Not on file  . Transportation needs:    Medical: Not on file    Non-medical: Not on file  Tobacco Use  . Smoking status: Never Smoker  . Smokeless tobacco: Never Used  Substance and Sexual Activity  . Alcohol use: Yes    Comment: occasional wine  . Drug use: No  . Sexual activity: Not on file  Lifestyle  . Physical activity:    Days per week: Not on file    Minutes per session: Not on file  . Stress: Not on file  Relationships  . Social connections:    Talks on phone: Not on file    Gets together: Not on file    Attends religious service: Not on file    Active member of club or organization: Not on file    Attends meetings of clubs or organizations: Not on file    Relationship status: Not on file  . Intimate partner violence:    Fear of current  or ex partner: Not on file    Emotionally abused: Not on file    Physically abused: Not on file    Forced sexual activity: Not on file  Other Topics Concern  . Not on file  Social History Narrative  . 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 Sulfamethoxazole-Trimethoprim     REACTION: pruritic rash     Outpatient Medications Prior to Visit  Medication Sig Dispense Refill  . albuterol (PROVENTIL HFA) 108 (90 BASE) MCG/ACT inhaler Inhale 2 puffs into the lungs every 6 (six) hours as needed.    . hydrochlorothiazide  (HYDRODIURIL) 25 MG tablet Take 25 mg by mouth daily.    . cetirizine (ZYRTEC) 10 MG tablet Take 10 mg by mouth daily. Alternates with allegra and claritin    . fexofenadine (ALLEGRA) 180 MG tablet Take 180 mg by mouth daily. Alternates with zyrtec and claritin    . fluticasone (FLONASE) 50 MCG/ACT nasal spray Place 2 sprays into the nose daily. 1 g 6  . ibuprofen (ADVIL,MOTRIN) 200 MG tablet Take 200 mg by mouth every 6 (six) hours as needed.      . loratadine (CLARITIN) 10 MG tablet Take 10 mg by mouth daily. Alternates with zyrtec and allegra    . mometasone (ASMANEX 120 METERED DOSES) 220 MCG/INH inhaler Inhale 2 puffs into the lungs daily. 1 Inhaler 12  . pseudoephedrine (SUDAFED) 120 MG 12 hr tablet Take 120 mg by mouth as needed.    . terbinafine (LAMISIL) 250 MG tablet Take 1 tablet by mouth daily.     No facility-administered medications prior to visit.         Objective:   Physical Exam Vitals:   08/27/17 0913  BP: (!) 168/98  Pulse: 74  SpO2: 96%  Weight: 246 lb (111.6 kg)  Height: 5\' 8"  (1.727 m)   Gen: Pleasant, well-nourished, in no distress,  normal affect  ENT: No lesions,  mouth clear,  oropharynx clear, no postnasal drip  Neck: No JVD, no stridor  Lungs: No use of accessory muscles, coarse breath sounds bilaterally, bilateral expiratory rhonchi and intermittent wheezes  Cardiovascular: RRR, heart sounds normal, no murmur or gallops, no peripheral edema  Musculoskeletal: No deformities, no cyanosis or clubbing  Neuro: alert, non focal  Skin: Warm, no lesions or rashes      Assessment & Plan:  Asthma Most prevalent symptom is mucus production with cough, usually progressive over several weeks time until he needs to be treated with corticosteroids.  He has been on various controller medications, stopped an inhaled steroid about 1 month ago as it did not seem to be breaking this cycle.  Not on aggressive allergy control as above because his sinus symptoms,  rhinitis have not been prominent.  Suspect that he needs a controller bronchodilator, ICS.  I want to quantify his degree of obstruction.  Controlling his symptoms may also require more aggressive allergy control.  Consider Singulair, consider referral back for repeat allergy testing and immunotherapy since he has significant atopy.   Allergic rhinitis Alternates the non-sedating anti-histamines. May need nasal steroid added back although the nasal sx are not prominent.  Suspect he would benefit from referral back for immunotherapy.  May benefit from Singulair as above.  We will decide which regimen to focus on once he has has pulmonary function testing and chest x-ray.  Levy Pupa, MD, PhD 08/27/2017,  10:45 AM [facility] [phone] or if no answer [phone]

## 2017-08-27 NOTE — Assessment & Plan Note (Signed)
Most prevalent symptom is mucus production with cough, usually progressive over several weeks time until he needs to be treated with corticosteroids.  He has been on various controller medications, stopped an inhaled steroid about 1 month ago as it did not seem to be breaking this cycle.  Not on aggressive allergy control as above because his sinus symptoms, rhinitis have not been prominent.  Suspect that he needs a controller bronchodilator, ICS.  I want to quantify his degree of obstruction.  Controlling his symptoms may also require more aggressive allergy control.  Consider Singulair, consider referral back for repeat allergy testing and immunotherapy since he has significant atopy.

## 2017-08-29 ENCOUNTER — Telehealth: Payer: Self-pay | Admitting: Emergency Medicine

## 2017-08-29 MED ORDER — PREDNISONE 20 MG PO TABS: 40.0000 mg | ORAL_TABLET | Freq: Every day | ORAL | 0 refills | Status: DC

## 2017-08-29 NOTE — Telephone Encounter (Signed)
Need to insure he is taking anti-histamine as instructed, has tried albuterol prn as instructed, before giving him another pred taper.

## 2017-08-29 NOTE — Telephone Encounter (Signed)
Spoke with pt. States that he is not feeling well. Reports chest congestion, cough, wheezing and slight SOB. Cough is producing green mucus. Denies chest tightness or fever. Symptoms started getting worse since his last office visit with Dr. Delton CoombesByrum on 08/27/17. Has been taking Sudafed with minimal relief. He would like to have a prednisone prescription sent in.  Dr. Delton CoombesByrum - please advise. Thanks!

## 2017-08-29 NOTE — Telephone Encounter (Signed)
Prednisone 40 mg daily x 5 days

## 2017-08-29 NOTE — Telephone Encounter (Signed)
Called and spoke with patient, he is aware. Sent to verified pharmacy

## 2017-08-29 NOTE — Telephone Encounter (Signed)
Spoke with pt. States that he has been taking an antihistamine and using albuterol as needed.  Dr. Delton CoombesByrum - please advise. Thanks.

## 2017-08-29 NOTE — Telephone Encounter (Signed)
Attempted to call the pt. I did not receive an answer. I have left a message for the pt to return our call.  

## 2017-08-29 NOTE — Telephone Encounter (Signed)
Pt is returning call. Cb is [phone]

## 2017-09-10 ENCOUNTER — Telehealth: Payer: Self-pay | Admitting: Emergency Medicine

## 2017-09-10 MED ORDER — PREDNISONE 10 MG PO TABS: ORAL_TABLET | ORAL | 0 refills | Status: DC

## 2017-09-10 NOTE — Telephone Encounter (Signed)
Spoke with patient. He is aware of SG's recommendations. [facility] go ahead and [facility]   Nothing else needed at time of call.

## 2017-09-10 NOTE — Telephone Encounter (Signed)
Spoke with patient. He was given a prednisone taper on 08/29/17 by Dr. Delton CoombesByrum. Instructions were to take 2 tablets (40mg ) for 5 days. He stated that he finished the prednisone about 10 days ago and his symptoms are coming back.   He has a productive cough with yellow phlegm. Increased wheezing. Denies any body aches or fevers. Increased fatigue.   He wants to know if he should be on another round of prednisone.   Wishes to use [facility] in [place].   SG, please advise since RB is not in the office today. Thanks!

## 2017-09-10 NOTE — Telephone Encounter (Signed)
Prednisone taper; 10 mg tablets: 4 tabs x 2 days, 3 tabs x 2 days, 2 tabs x 2 days 1 tab x 2 days then stop. Remind him to use his albuterol inhaler as rescue. If no better will need to be seen before any further tele prescribing.  We may need to move up PFT's so we can try him on inhaler therapy sooner than June. Thanks

## 2017-09-10 NOTE — Addendum Note (Signed)
Addended by: Maurene CapesPOTTS, Cid Agena M on: 09/10/2017 01:21 PM   Modules accepted: Orders

## 2017-09-26 ENCOUNTER — Ambulatory Visit: Payer: BLUE CROSS/BLUE SHIELD | Admitting: Acute Care

## 2017-09-26 ENCOUNTER — Encounter: Payer: Self-pay | Admitting: Acute Care

## 2017-09-26 ENCOUNTER — Ambulatory Visit (INDEPENDENT_AMBULATORY_CARE_PROVIDER_SITE_OTHER)
Admission: RE | Admit: 2017-09-26 | Discharge: 2017-09-26 | Disposition: A | Discharge status: Home / Self Care | Payer: BLUE CROSS/BLUE SHIELD | Source: Ambulatory Visit | Attending: Emergency Medicine | Admitting: Emergency Medicine

## 2017-09-26 VITALS — BP 148/100 | HR 84 | Ht 68.0 in | Wt 242.6 lb

## 2017-09-26 DIAGNOSIS — J45902 Unspecified asthma with status asthmaticus: Secondary | ICD-10-CM | POA: Diagnosis not present

## 2017-09-26 DIAGNOSIS — J45901 Unspecified asthma with (acute) exacerbation: Secondary | ICD-10-CM

## 2017-09-26 DIAGNOSIS — R05 Cough: Secondary | ICD-10-CM | POA: Diagnosis not present

## 2017-09-26 LAB — NITRIC OXIDE: Nitric Oxide: 99

## 2017-09-26 MED ORDER — HYDROCODONE-HOMATROPINE 5-1.5 MG/5ML PO SYRP: 5.0000 mL | ORAL_SOLUTION | Freq: Four times a day (QID) | ORAL | 0 refills | Status: DC | PRN

## 2017-09-26 MED ORDER — BUDESONIDE-FORMOTEROL FUMARATE 160-4.5 MCG/ACT IN AERO: 2.0000 | INHALATION_SPRAY | Freq: Two times a day (BID) | RESPIRATORY_TRACT | 0 refills | Status: DC

## 2017-09-26 MED ORDER — LEVALBUTEROL HCL 0.63 MG/3ML IN NEBU: 0.6300 mg | INHALATION_SOLUTION | Freq: Every day | RESPIRATORY_TRACT | Status: DC

## 2017-09-26 MED ORDER — LEVALBUTEROL HCL 0.63 MG/3ML IN NEBU: 0.6300 mg | INHALATION_SOLUTION | Freq: Once | RESPIRATORY_TRACT | Status: AC

## 2017-09-26 MED ORDER — PREDNISONE 10 MG PO TABS: ORAL_TABLET | ORAL | 0 refills | Status: DC

## 2017-09-26 MED ORDER — DOXYCYCLINE HYCLATE 100 MG PO TABS: 100.0000 mg | ORAL_TABLET | Freq: Two times a day (BID) | ORAL | 0 refills | Status: DC

## 2017-09-26 MED ORDER — MONTELUKAST SODIUM 10 MG PO TABS: 10.0000 mg | ORAL_TABLET | Freq: Every day | ORAL | 5 refills | Status: DC

## 2017-09-26 NOTE — Assessment & Plan Note (Signed)
Frequent exacerbations that get better with prednisone Multiple tapers per Pulmonary and PCP in last 3 months Plan: Xopenex treatment now, We will do a FENO today>> 99 ppb CXR today. We will call you with results. Mucinex 1200 mg once daily. Take with a full glass of water. Doxycycline 100 mg twice  daily x 7 days Prednisone taper; 10 mg tablets: 4 tabs x 3 days, 3 tabs x 3 days, 2 tabs x 3 days 1 tab x 3 days then stop. Delsym during the day. Hydromet cough syrup 5 cc's at bedtime  Do not drive if sleepy. PFT's as scheduled Follow up with PCP regarding elevated BP. Blood Allergy profile. CBC with diff Symbicort 2 puffs twice daily Rinse mouth after use Singulair 10 mg once daily in the morning Claritin 10 mg daily at night( or xyzol, or Allegra or Zyrtec) Follow up with Dr. Delton Coombes in 4 weeks as is scheduled. Please contact office for sooner follow up if symptoms do not improve or worsen or seek emergency care

## 2017-09-26 NOTE — Progress Notes (Signed)
History of Present Illness Michael Schaefer is a 57 y.o. male never smoker with allergic rhinitis formerly on immunotherapy, eosinophilic esophagitis with a Schatzki's ring, chronic cough and asthma. He is followed by Dr. Delton Schaefer.   09/26/2017 Acute OV: Pt presents today for acute OV. He was seen 08/29/17  by Dr. Delton Schaefer for worsening mucus production, and suspected asthma flares. He had stopped his ICS inhaler 07/2017. He was told to continue to use his  daily allergy pill (loratadine, Zyrtec or Allegra) And  Sudafed as needed, and to continue Albuterol  2 puffs if needed for chest tightness, cough, mucus clearance. He was prescribed a pred taper by Dr. Delton Schaefer 4/10 ( 40 mg x 5 days). PFT's were ordered, and are not scheduled to be done until 10/2017. Marland Kitchen He states he completed the prednisone and called the office for worsening symptoms again on 4/22. He was prescribed another pred taper and told to have a follow up OV. He presents today. He states he completed his second prednisone taper. His symptoms improved on the prednisone. He has also been getting prednisone from his PCP. ( Dr. Izola Schaefer). He states the prednisone clears up his flare, and then within a few days of completing the taper he is sick again. He states that he has had multiple inhalers over the years. He is asking for additional prednisone today.He has shortness of breath, cough with greenish yellow secretions.He states he also has thick clear secretions. He is taking claritin and Sudafed for his chest congestion. He is not using Mucinex. He is not using anything for cough. He denies fever, chest pain, orthopnea or hemoptysis.  Next appointment is 10/30/2017 with Dr. Delton Schaefer  Test Results: CXR 09/26/2017>> Chronic bronchitic changes.  No alveolar pneumonia nor CHF.  5.8.2019>>  CBC with diff Blood Allergy profile  No flowsheet data found.  No flowsheet data found.  BNP No results found for: BNP  ProBNP No results found for: PROBNP  PFT No  results found for: FEV1PRE, FEV1POST, FVCPRE, FVCPOST, TLC, DLCOUNC, PREFEV1FVCRT, PSTFEV1FVCRT  Dg Chest 2 View  Result Date: 09/26/2017 CLINICAL DATA:  Chronic cough, chest congestion, and shortness of breath with increasing severity. History of asthma, nonsmoker. EXAM: CHEST - 2 VIEW COMPARISON:  Chest x-ray of June 21, 2017 FINDINGS: The lungs are adequately inflated. There is no focal infiltrate. There is no pleural effusion. The interstitial markings are coarse. The heart and pulmonary vascularity are normal. The mediastinum is normal in width. There is tortuosity of the descending thoracic aorta. The bony thorax exhibits no acute abnormality. IMPRESSION: Chronic bronchitic changes.  No alveolar pneumonia nor CHF. Thoracic aortic atherosclerosis. Electronically Signed   By: Michael  Schaefer M.D.   On: 09/26/2017 16:22     Past medical hx Past Medical History:  Diagnosis Date  . Allergic rhinitis   . Cough   . Eosinophilic esophagitis   . MRSA (methicillin resistant staph aureus) culture positive    x 9  . Schatzki's ring      Social History   Tobacco Use  . Smoking status: Never Smoker  . Smokeless tobacco: Never Used  Substance Use Topics  . Alcohol use: Yes    Comment: occasional wine  . Drug use: No    Mr.Michael Schaefer reports that he has never smoked. He has never used smokeless tobacco. He reports that he drinks alcohol. He reports that he does not use drugs.  Tobacco Cessation: Never smoker   Past surgical hx, Family hx, Social hx all  reviewed.  Current Outpatient Medications on File Prior to Visit  Medication Sig  . albuterol (PROVENTIL HFA) 108 (90 BASE) MCG/ACT inhaler Inhale 2 puffs into the lungs every 6 (six) hours as needed.  . hydrochlorothiazide (HYDRODIURIL) 25 MG tablet Take 25 mg by mouth daily.  . predniSONE (DELTASONE) 10 MG tablet Take 4 tabs for 2 days, then 3 tabs for 2 days, 2 tabs for 2 days, then 1 tab for 2 days, then stop.  . predniSONE (DELTASONE)  20 MG tablet Take 2 tablets (40 mg total) by mouth daily with breakfast.   No current facility-administered medications on file prior to visit.      Allergies  Allergen Reactions  . Sulfamethoxazole-Trimethoprim     REACTION: pruritic rash    Review Of Systems:  Constitutional:   No  weight loss, night sweats,  Fevers, chills, fatigue, or  lassitude.  HEENT:   No headaches,  Difficulty swallowing,  Tooth/dental problems, or  Sore throat,                No sneezing, itching, ear ache,+ nasal congestion, post nasal drip,   CV:  No chest pain,  + chet tightness, Orthopnea, PND, swelling in lower extremities, anasarca, dizziness, palpitations, syncope.   GI  No heartburn, indigestion, abdominal pain, nausea, vomiting, diarrhea, change in bowel habits, loss of appetite, bloody stools.   Resp: + shortness of breath with exertion or at rest.  + excess mucus, + productive cough,  No non-productive cough,  No coughing up of blood.  + change in color of mucus.  + wheezing.  No chest wall deformity  Skin: no rash or lesions.  GU: no dysuria, change in color of urine, no urgency or frequency.  No flank pain, no hematuria   MS:  No joint pain or swelling.  No decreased range of motion.  No back pain.  Psych:  No change in mood or affect. No depression or anxiety.  No memory loss.   Vital Signs BP (!) 148/100 (BP Location: Left Arm, Cuff Size: Normal)   Pulse 84   Ht  (1.727 m)   Wt 242 lb 9.6 oz (110 kg)   SpO2 92%   BMI 36.89 kg/m    Physical Exam:  General- No distress,  A&Ox3, pleasant ENT: No sinus tenderness, TM clear, pale nasal mucosa, no oral exudate,+ post nasal drip, no LAN Cardiac: S1, S2, regular rate and rhythm, no murmur Chest: + Insp and Exp wheeze/ No rales/ dullness; no accessory muscle use, no nasal flaring, no sternal retractions Abd.: Soft Non-tender, ND, Obese Ext: No clubbing cyanosis, edema Neuro:  normal strength Skin: No rashes, warm and  dry Psych: normal mood and behavior   Assessment/Plan  Asthma exacerbation Frequent exacerbations that get better with prednisone Multiple tapers per Pulmonary and PCP in last 3 months Plan: Xopenex treatment now, We will do a FENO today>> 99 ppb CXR today. We will call you with results. Mucinex 1200 mg once daily. Take with a full glass of water. Doxycycline 100 mg twice  daily x 7 days Prednisone taper; 10 mg tablets: 4 tabs x 3 days, 3 tabs x 3 days, 2 tabs x 3 days 1 tab x 3 days then stop. Delsym during the day. Hydromet cough syrup 5 cc's at bedtime  Do not drive if sleepy. PFT's as scheduled Follow up with PCP regarding elevated BP. Blood Allergy profile. CBC with diff Symbicort 2 puffs twice daily Rinse mouth after use Singulair  10 mg once daily in the morning Claritin 10 mg daily at night( or xyzol, or Allegra or Zyrtec) Follow up with Dr. Delton Schaefer in 4 weeks as is scheduled. Please contact office for sooner follow up if symptoms do not improve or worsen or seek emergency care     Consider allergy referral/ biologic at follow up.  Bevelyn Ngo, NP 09/26/2017  9:44 PM

## 2017-09-26 NOTE — Patient Instructions (Addendum)
It is good to meet you today. Xopenex treatment now, We will do a FENO today>> 99 ppb CXR today. We will call you with results. Mucinex 1200 mg once daily. Take with a full glass of water. Doxycycline 100 mg twice  daily x 7 days Prednisone taper; 10 mg tablets: 4 tabs x 3 days, 3 tabs x 3 days, 2 tabs x 3 days 1 tab x 3 days then stop. Delsym during the day. Hydromet cough syrup 5 cc's at bedtime  Do not drive if sleepy. PFT's as scheduled Follow up with PCP regarding elevated BP. Blood Allergy profile. CBC with diff Symbicort 2 puffs twice daily Rinse mouth after use Singulair 10 mg once daily in the morning Claritin 10 mg daily at night( or xyzol, or Allegra or Zyrtec) Follow up with Dr. Delton Coombes in 4 weeks as is scheduled. Please contact office for sooner follow up if symptoms do not improve or worsen or seek emergency care

## 2017-10-30 ENCOUNTER — Encounter: Payer: Self-pay | Admitting: Emergency Medicine

## 2017-10-30 ENCOUNTER — Ambulatory Visit: Payer: BLUE CROSS/BLUE SHIELD | Admitting: Emergency Medicine

## 2017-10-30 ENCOUNTER — Ambulatory Visit: Payer: BLUE CROSS/BLUE SHIELD | Admitting: Pulmonary Disease

## 2017-10-30 ENCOUNTER — Ambulatory Visit (INDEPENDENT_AMBULATORY_CARE_PROVIDER_SITE_OTHER): Payer: BLUE CROSS/BLUE SHIELD | Admitting: Emergency Medicine

## 2017-10-30 DIAGNOSIS — J45902 Unspecified asthma with status asthmaticus: Secondary | ICD-10-CM | POA: Diagnosis not present

## 2017-10-30 DIAGNOSIS — J4540 Moderate persistent asthma, uncomplicated: Secondary | ICD-10-CM | POA: Diagnosis not present

## 2017-10-30 LAB — PULMONARY FUNCTION TEST
DL/VA % PRED: 122 %
DL/VA: 5.51 ml/min/mmHg/L
DLCO UNC % PRED: 138 %
DLCO unc: 40.92 ml/min/mmHg
FEF 25-75 POST: 2.83 L/s
FEF 25-75 PRE: 2.52 L/s
FEF2575-%CHANGE-POST: 12 %
FEF2575-%PRED-PRE: 87 %
FEF2575-%Pred-Post: 98 %
FEV1-%Change-Post: 2 %
FEV1-%PRED-POST: 98 %
FEV1-%Pred-Pre: 96 %
FEV1-Post: 3.37 L
FEV1-Pre: 3.29 L
FEV1FVC-%CHANGE-POST: 4 %
FEV1FVC-%Pred-Pre: 99 %
FEV6-%Change-Post: -1 %
FEV6-%Pred-Post: 99 %
FEV6-%Pred-Pre: 101 %
FEV6-Post: 4.25 L
FEV6-Pre: 4.33 L
FEV6FVC-%Change-Post: 0 %
FEV6FVC-%Pred-Post: 105 %
FEV6FVC-%Pred-Pre: 104 %
FVC-%CHANGE-POST: -1 %
FVC-%PRED-POST: 94 %
FVC-%Pred-Pre: 96 %
FVC-PRE: 4.35 L
FVC-Post: 4.26 L
POST FEV1/FVC RATIO: 79 %
PRE FEV1/FVC RATIO: 76 %
Post FEV6/FVC ratio: 100 %
Pre FEV6/FVC Ratio: 100 %
RV % pred: 90 %
RV: 1.9 L
TLC % pred: 109 %
TLC: 7.23 L

## 2017-10-30 MED ORDER — MONTELUKAST SODIUM 10 MG PO TABS: 10.0000 mg | ORAL_TABLET | Freq: Every day | ORAL | 5 refills | Status: AC

## 2017-10-30 MED ORDER — BUDESONIDE-FORMOTEROL FUMARATE 160-4.5 MCG/ACT IN AERO: 2.0000 | INHALATION_SPRAY | Freq: Two times a day (BID) | RESPIRATORY_TRACT | 5 refills | Status: AC

## 2017-10-30 NOTE — Progress Notes (Signed)
Subjective:    Patient ID: Michael Schaefer, male    DOB: [birth year], 57 y.o.   MRN: [id number]  HPI 57 year old never smoker previously followed by Dr. Delford Field in our office.  He has a history of allergic rhinitis formerly on immunotherapy, eosinophilic esophagitis with a Schatzki's ring, chronic cough and asthma.  He has been treated in the past with Asmanex and Symbicort, multiple other scheduled meds given as samples by his PCP.  He currently is using albuterol only as needed. He uses approximately 1-2x a week.   He describes symptoms of intermittent cough and wheeze, minimal dyspnea, has been treated on multiple occasions with prednisone to quiet cough over the last 3-4 yrs. No significant nasal congestion or rhinorrhea. He then has congestion returns and leads to cough with clear mucous that progresses in thickness, has a greenish yellow color. He alternates loratadine, alternated w zyrtec and allegra. Uses sudafed most days as well. His last pred was late February.   ROV 10/30/17 --57 year old man with history of allergic rhinitis, eosinophilic disease as outlined above, chronic cough and asthma.  He has been dealing with recurrent flaring symptoms and has been treated with corticosteroids several times this year.  This includes acute visit approximately 1 month ago when he was treated with pred and doxy. Characterized by mucous and cough, now better. CXR with chronic bronchitis sx.   FENO at that visit was markedly elevated.  Respiratory allergy profile was ordered but I do not see that it was done, eosinophils were not done either. He is back on Symbicort since last visit here. Has xopenex available. He has been on immunotherapy before, stopped it 4 yrs ago.    Review of Systems  Constitutional: Negative for fever and unexpected weight change.  HENT: Negative for congestion, dental problem, ear pain, nosebleeds, postnasal drip, rhinorrhea, sinus pressure, sneezing, sore throat and trouble  swallowing.   Eyes: Negative for redness and itching.  Respiratory: Negative for cough, chest tightness, shortness of breath and wheezing.   Cardiovascular: Negative for palpitations and leg swelling.  Gastrointestinal: Negative for nausea and vomiting.  Genitourinary: Negative for dysuria.  Musculoskeletal: Negative for joint swelling.  Skin: Negative for rash.  Neurological: Negative for headaches.  Hematological: Does not bruise/bleed easily.  Psychiatric/Behavioral: Negative for dysphoric mood. The patient is not nervous/anxious.     Past Medical History:  Diagnosis Date  . Allergic rhinitis   . Cough   . Eosinophilic esophagitis   . MRSA (methicillin resistant staph aureus) culture positive    x 9  . Schatzki's ring      Family History  Problem Relation Age of Onset  . COPD Mother   . Uterine cancer Mother   . Asthma Daughter      Social History   Socioeconomic History  . Marital status: Single    Spouse name: Not on file  . Number of children: Not on file  . Years of education: Not on file  . Highest education level: Not on file  Occupational History  . Not on file  Social Needs  . Financial resource strain: Not on file  . Food insecurity:    Worry: Not on file    Inability: Not on file  . Transportation needs:    Medical: Not on file    Non-medical: Not on file  Tobacco Use  . Smoking status: Never Smoker  . Smokeless tobacco: Never Used  Substance and Sexual Activity  . Alcohol use: Yes  Comment: occasional wine  . Drug use: No  . Sexual activity: Not on file  Lifestyle  . Physical activity:    Days per week: Not on file    Minutes per session: Not on file  . Stress: Not on file  Relationships  . Social connections:    Talks on phone: Not on file    Gets together: Not on file    Attends religious service: Not on file    Active member of club or organization: Not on file    Attends meetings of clubs or organizations: Not on file     Relationship status: Not on file  . Intimate partner violence:    Fear of current or ex partner: Not on file    Emotionally abused: Not on file    Physically abused: Not on file    Forced sexual activity: Not on file  Other Topics Concern  . Not on file  Social History Narrative  . Not on file  has lived in [place], [place],  Worked air conditioning / refrigeration.  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 Sulfamethoxazole-Trimethoprim     REACTION: pruritic rash     Outpatient Medications Prior to Visit  Medication Sig Dispense Refill  . albuterol (PROVENTIL HFA) 108 (90 BASE) MCG/ACT inhaler Inhale 2 puffs into the lungs every 6 (six) hours as needed.    . predniSONE (DELTASONE) 20 MG tablet Take 2 tablets (40 mg total) by mouth daily with breakfast. 10 tablet 0  . budesonide-formoterol (SYMBICORT) 160-4.5 MCG/ACT inhaler Inhale 2 puffs into the lungs 2 (two) times daily. 2 Inhaler 0  . doxycycline (VIBRA-TABS) 100 MG tablet Take 1 tablet (100 mg total) by mouth 2 (two) times daily. 14 tablet 0  . hydrochlorothiazide (HYDRODIURIL) 25 MG tablet Take 25 mg by mouth daily.    Marland Kitchen. HYDROcodone-homatropine (HYDROMET) 5-1.5 MG/5ML syrup Take 5 mLs by mouth every 6 (six) hours as needed for cough. 120 mL 0  . montelukast (SINGULAIR) 10 MG tablet Take 1 tablet (10 mg total) by mouth at bedtime. 30 tablet 5  . predniSONE (DELTASONE) 10 MG tablet Take 4 tabs for 2 days, then 3 tabs for 2 days, 2 tabs for 2 days, then 1 tab for 2 days, then stop. 20 tablet 0  . predniSONE (DELTASONE) 10 MG tablet Take 4 tabs by mouth for 3 days, then 3 for 3 days, 2 for 3 days, 1 for 3 days and stop 30 tablet 0   Facility-Administered Medications Prior to Visit  Medication Dose Route Frequency Provider Last Rate Last Dose  . levalbuterol (XOPENEX) nebulizer solution 0.63 mg  0.63 mg Nebulization Once Bevelyn NgoGroce, Sarah F, NP            Objective:   Physical Exam Vitals:   10/30/17 0925  BP: 140/82    Pulse: 88  SpO2: 92%  Weight: 244 lb 9.6 oz (110.9 kg)  Height: 5\' 8"  (1.727 m)   Gen: Pleasant, well-nourished, in no distress,  normal affect  ENT: No lesions,  mouth clear,  oropharynx clear, no postnasal drip  Neck: No JVD, no stridor  Lungs: No use of accessory muscles, coarse breath sounds bilaterally, bilateral expiratory rhonchi and intermittent wheezes  Cardiovascular: RRR, heart sounds normal, no murmur or gallops, no peripheral edema  Musculoskeletal: No deformities, no cyanosis or clubbing  Neuro: alert, non focal  Skin: Warm, no lesions or rashes      Assessment & Plan:  Asthma His obstructive  lung disease appears to be most related to mucus burden as opposed to overt bronchospasm.  He is back on Symbicort, may be benefiting some. Had another flare that required pred.  I suspect based on the nature of the symptoms and the refractory nature he will have to go back to an allergist and back on immunotherapy.  We will try to maximize nonspecific allergy therapy in the meantime.  Some question is whether to continue the Symbicort I think right now it would be beneficial to do so, we may be able to switch him to an ICS alone at some point in the future.  Please continue Symbicort 2 puffs twice a day.  Remember to rinse and gargle after using this medication. We will restart Singulair 10 mg each evening. Continue to take a daily antihistamine (either loratadine, Zyrtec, Allegra) You may continue to use Sudafed as needed as you have been doing. Depending on your progress we may decide that you need to restart  allergy shots. Follow with Dr Delton Coombes in 2- 3 months or sooner if you have any problems.  Levy Pupa, MD, PhD 10/30/2017, 1:24 PM [facility] [phone] or if no answer [phone]

## 2017-10-30 NOTE — Progress Notes (Signed)
PFT completed 10/30/17  

## 2017-10-30 NOTE — Assessment & Plan Note (Signed)
His obstructive lung disease appears to be most related to mucus burden as opposed to overt bronchospasm.  He is back on Symbicort, may be benefiting some. Had another flare that required pred.  I suspect based on the nature of the symptoms and the refractory nature he will have to go back to an allergist and back on immunotherapy.  We will try to maximize nonspecific allergy therapy in the meantime.  Some question is whether to continue the Symbicort I think right now it would be beneficial to do so, we may be able to switch him to an ICS alone at some point in the future.  Please continue Symbicort 2 puffs twice a day.  Remember to rinse and gargle after using this medication. We will restart Singulair 10 mg each evening. Continue to take a daily antihistamine (either loratadine, Zyrtec, Allegra) You may continue to use Sudafed as needed as you have been doing. Depending on your progress we may decide that you need to restart  allergy shots. Follow with Dr Delton CoombesByrum in 2- 3 months or sooner if you have any problems.

## 2017-10-30 NOTE — Patient Instructions (Signed)
Please continue Symbicort 2 puffs twice a day.  Remember to rinse and gargle after using this medication. We will restart Singulair 10 mg each evening. Continue to take a daily antihistamine (either loratadine, Zyrtec, Allegra) You may continue to use Sudafed as needed as you have been doing. Depending on your progress we may decide that you need to restart  allergy shots. Follow with Dr Delton CoombesByrum in 2- 3 months or sooner if you have any problems.

## 2017-11-16 ENCOUNTER — Telehealth: Payer: Self-pay | Admitting: Emergency Medicine

## 2017-11-16 MED ORDER — AZITHROMYCIN 250 MG PO TABS: 250.0000 mg | ORAL_TABLET | Freq: Every day | ORAL | 0 refills | Status: AC

## 2017-11-16 MED ORDER — PREDNISONE 10 MG PO TABS: 10.0000 mg | ORAL_TABLET | Freq: Every day | ORAL | 0 refills | Status: DC

## 2017-11-16 NOTE — Telephone Encounter (Signed)
Pt states he is having another flare up of congestion in lungs and hard to breathe. Cough-productive and clear to tinge yellow in color. Denies and fever or chills. States prednisone given in the past helps the best.   RB Please advise. Thanks.

## 2017-11-16 NOTE — Telephone Encounter (Signed)
Called and spoke with patient regarding results.  Informed the patient of results and recommendations today. Placed order for azithromycin Z-pac, and prednisone taper to [facility] [place].  Pt verbalized understanding and denied any questions or concerns at this time.  Nothing further needed.

## 2017-11-16 NOTE — Telephone Encounter (Signed)
[facility] is calling back[facility] [place]. Cb is [phone].

## 2017-11-16 NOTE — Telephone Encounter (Signed)
Called and spoke with patient regarding his previous message this morning not feeling well. Pt reports is having another asthma flare up of congestion in lungs and hard to breathe.  Cough-productive and clear to tinge yellow in color, Denies and fever or chills.  States prednisone given in the past helps the best.  Pt is using Symbicort 2 puffs twice a day, Singulair 10 mg each evening and zyrtec daily. Pt denied ov today for acute visit. Last ov with RB was 10/30/17.  RB please advise

## 2017-11-16 NOTE — Telephone Encounter (Signed)
Okay to call in azithromycin, Z-Pak Prednisone taper: Take 40mg  daily for 3 days, then 30mg  daily for 3 days, then 20mg  daily for 3 days, then 10mg  daily for 3 days, then stop

## 2017-11-30 DIAGNOSIS — Z23 Encounter for immunization: Secondary | ICD-10-CM | POA: Diagnosis not present

## 2017-11-30 DIAGNOSIS — S0101XA Laceration without foreign body of scalp, initial encounter: Secondary | ICD-10-CM | POA: Diagnosis not present

## 2018-01-25 DIAGNOSIS — R05 Cough: Secondary | ICD-10-CM | POA: Diagnosis not present

## 2018-01-25 DIAGNOSIS — J441 Chronic obstructive pulmonary disease with (acute) exacerbation: Secondary | ICD-10-CM | POA: Diagnosis not present

## 2018-01-25 DIAGNOSIS — Z9109 Other allergy status, other than to drugs and biological substances: Secondary | ICD-10-CM | POA: Diagnosis not present

## 2018-01-31 ENCOUNTER — Ambulatory Visit: Payer: BLUE CROSS/BLUE SHIELD | Admitting: Emergency Medicine

## 2018-03-08 DIAGNOSIS — M542 Cervicalgia: Secondary | ICD-10-CM | POA: Diagnosis not present

## 2018-03-08 DIAGNOSIS — M25512 Pain in left shoulder: Secondary | ICD-10-CM | POA: Diagnosis not present

## 2018-03-25 DIAGNOSIS — R0989 Other specified symptoms and signs involving the circulatory and respiratory systems: Secondary | ICD-10-CM | POA: Diagnosis not present

## 2018-03-25 DIAGNOSIS — J45909 Unspecified asthma, uncomplicated: Secondary | ICD-10-CM | POA: Diagnosis not present

## 2019-04-13 ENCOUNTER — Encounter (HOSPITAL_BASED_OUTPATIENT_CLINIC_OR_DEPARTMENT_OTHER): Payer: Self-pay | Admitting: Respiratory Therapy

## 2019-04-13 ENCOUNTER — Other Ambulatory Visit: Payer: Self-pay

## 2019-04-13 ENCOUNTER — Inpatient Hospital Stay (HOSPITAL_BASED_OUTPATIENT_CLINIC_OR_DEPARTMENT_OTHER)
Admission: EM | Admit: 2019-04-13 | Discharge: 2019-04-13 | DRG: 203 | Discharge status: Against Medical Advice | Payer: Self-pay | Attending: Internal Medicine | Admitting: Internal Medicine

## 2019-04-13 ENCOUNTER — Emergency Department (HOSPITAL_BASED_OUTPATIENT_CLINIC_OR_DEPARTMENT_OTHER): Payer: Self-pay

## 2019-04-13 DIAGNOSIS — Z7952 Long term (current) use of systemic steroids: Secondary | ICD-10-CM

## 2019-04-13 DIAGNOSIS — Z825 Family history of asthma and other chronic lower respiratory diseases: Secondary | ICD-10-CM

## 2019-04-13 DIAGNOSIS — Z7951 Long term (current) use of inhaled steroids: Secondary | ICD-10-CM

## 2019-04-13 DIAGNOSIS — R0902 Hypoxemia: Secondary | ICD-10-CM | POA: Diagnosis present

## 2019-04-13 DIAGNOSIS — Z79899 Other long term (current) drug therapy: Secondary | ICD-10-CM

## 2019-04-13 DIAGNOSIS — Z20828 Contact with and (suspected) exposure to other viral communicable diseases: Secondary | ICD-10-CM | POA: Diagnosis present

## 2019-04-13 DIAGNOSIS — R062 Wheezing: Secondary | ICD-10-CM

## 2019-04-13 DIAGNOSIS — J45902 Unspecified asthma with status asthmaticus: Principal | ICD-10-CM | POA: Diagnosis present

## 2019-04-13 DIAGNOSIS — Z882 Allergy status to sulfonamides status: Secondary | ICD-10-CM

## 2019-04-13 DIAGNOSIS — J45901 Unspecified asthma with (acute) exacerbation: Secondary | ICD-10-CM | POA: Diagnosis present

## 2019-04-13 LAB — CBC WITH DIFFERENTIAL/PLATELET
Abs Immature Granulocytes: 0.01 10*3/uL (ref 0.00–0.07)
Basophils Absolute: 0.1 10*3/uL (ref 0.0–0.1)
Basophils Relative: 1 %
Eosinophils Absolute: 1.2 10*3/uL — ABNORMAL HIGH (ref 0.0–0.5)
Eosinophils Relative: 16 %
HCT: 46.4 % (ref 39.0–52.0)
Hemoglobin: 15 g/dL (ref 13.0–17.0)
Immature Granulocytes: 0 %
Lymphocytes Relative: 28 %
Lymphs Abs: 2.1 10*3/uL (ref 0.7–4.0)
MCH: 28.7 pg (ref 26.0–34.0)
MCHC: 32.3 g/dL (ref 30.0–36.0)
MCV: 88.9 fL (ref 80.0–100.0)
Monocytes Absolute: 0.8 10*3/uL (ref 0.1–1.0)
Monocytes Relative: 11 %
Neutro Abs: 3.4 10*3/uL (ref 1.7–7.7)
Neutrophils Relative %: 44 %
Platelets: 263 10*3/uL (ref 150–400)
RBC: 5.22 MIL/uL (ref 4.22–5.81)
RDW: 13.6 % (ref 11.5–15.5)
WBC: 7.6 10*3/uL (ref 4.0–10.5)
nRBC: 0 % (ref 0.0–0.2)

## 2019-04-13 LAB — BASIC METABOLIC PANEL
Anion gap: 9 (ref 5–15)
BUN: 12 mg/dL (ref 6–20)
CO2: 24 mmol/L (ref 22–32)
Calcium: 9.2 mg/dL (ref 8.9–10.3)
Chloride: 105 mmol/L (ref 98–111)
Creatinine, Ser: 1.04 mg/dL (ref 0.61–1.24)
GFR calc Af Amer: >60 mL/min (ref >60)
GFR calc non Af Amer: >60 mL/min (ref >60)
Glucose, Bld: 126 mg/dL — ABNORMAL HIGH (ref 70–99)
Potassium: 3.7 mmol/L (ref 3.5–5.1)
Sodium: 138 mmol/L (ref 135–145)

## 2019-04-13 LAB — SARS CORONAVIRUS 2 (TAT 6-24 HRS): SARS Coronavirus 2: NEGATIVE

## 2019-04-13 LAB — SARS CORONAVIRUS 2 AG (30 MIN TAT): SARS Coronavirus 2 Ag: NEGATIVE

## 2019-04-13 LAB — BRAIN NATRIURETIC PEPTIDE: B Natriuretic Peptide: 116.8 pg/mL — ABNORMAL HIGH (ref 0.0–100.0)

## 2019-04-13 LAB — TROPONIN I (HIGH SENSITIVITY)
Troponin I (High Sensitivity): 20 ng/L — ABNORMAL HIGH (ref <18)
Troponin I (High Sensitivity): 19 ng/L — ABNORMAL HIGH (ref <18)

## 2019-04-13 MED ORDER — ALBUTEROL SULFATE HFA 108 (90 BASE) MCG/ACT IN AERS
2.0000 | INHALATION_SPRAY | RESPIRATORY_TRACT | Status: DC | PRN
  Administered 2019-04-13: 2 via RESPIRATORY_TRACT

## 2019-04-13 MED ORDER — AMLODIPINE BESYLATE 5 MG PO TABS
5.0000 mg | ORAL_TABLET | Freq: Every day | ORAL | Status: DC
  Administered 2019-04-13: 5 mg via ORAL
  Filled 2019-04-13: qty 1

## 2019-04-13 MED ORDER — ALBUTEROL SULFATE HFA 108 (90 BASE) MCG/ACT IN AERS
8.0000 | INHALATION_SPRAY | Freq: Once | RESPIRATORY_TRACT | Status: AC
  Administered 2019-04-13: 8 via RESPIRATORY_TRACT
  Filled 2019-04-13: qty 6.7

## 2019-04-13 MED ORDER — DEXAMETHASONE SODIUM PHOSPHATE 10 MG/ML IJ SOLN
6.0000 mg | Freq: Once | INTRAMUSCULAR | Status: AC
  Administered 2019-04-13: 6 mg via INTRAVENOUS
  Filled 2019-04-13: qty 1

## 2019-04-13 MED ORDER — PREDNISONE 20 MG PO TABS: 40.0000 mg | ORAL_TABLET | Freq: Every day | ORAL | 0 refills | Status: AC

## 2019-04-13 MED ORDER — ALBUTEROL SULFATE HFA 108 (90 BASE) MCG/ACT IN AERS
8.0000 | INHALATION_SPRAY | Freq: Once | RESPIRATORY_TRACT | Status: AC
  Administered 2019-04-13: 8 via RESPIRATORY_TRACT

## 2019-04-13 MED ORDER — ALBUTEROL SULFATE HFA 108 (90 BASE) MCG/ACT IN AERS
4.0000 | INHALATION_SPRAY | RESPIRATORY_TRACT | Status: DC | PRN
  Administered 2019-04-13: 4 via RESPIRATORY_TRACT
  Filled 2019-04-13: qty 6.7

## 2019-04-13 MED ORDER — IPRATROPIUM BROMIDE HFA 17 MCG/ACT IN AERS
2.0000 | INHALATION_SPRAY | Freq: Once | RESPIRATORY_TRACT | Status: AC
  Administered 2019-04-13: 2 via RESPIRATORY_TRACT
  Filled 2019-04-13: qty 12.9

## 2019-04-13 MED ORDER — MAGNESIUM SULFATE 2 GM/50ML IV SOLN
2.0000 g | Freq: Once | INTRAVENOUS | Status: AC
  Administered 2019-04-13: 2 g via INTRAVENOUS
  Filled 2019-04-13: qty 50

## 2019-04-13 MED ORDER — LIDOCAINE-EPINEPHRINE-TETRACAINE (LET) TOPICAL GEL: 3.0000 mL | Freq: Once | TOPICAL | Status: DC

## 2019-04-13 MED ORDER — MONTELUKAST SODIUM 10 MG PO TABS
10.0000 mg | ORAL_TABLET | Freq: Every day | ORAL | Status: DC
  Filled 2019-04-13: qty 1

## 2019-04-13 NOTE — ED Notes (Signed)
Pt requesting to speak with provider re: possible d/c home and f/u with PCP on Monday. Dr. Alvino Chapel made aware. RT at bedside

## 2019-04-13 NOTE — Discharge Instructions (Signed)
You are leaving against our advice.  Take the steroids.  Follow-up as soon as possible with your doctor.  Return for worsening of symptoms.

## 2019-04-13 NOTE — ED Notes (Signed)
Pt discussed plan of care with Dr. Alvino Chapel. AMA form signed by pt. Advised to return at any time if symptoms worsen. RT at bedside to review proper use of inhaler with pt prior to leaving. Pt's wife with him at time of discharge. Pt alert, ambulatory with steady gait from ED doors to car.

## 2019-04-13 NOTE — ED Notes (Signed)
Pt ambulated in dept length of hall x 2 on room air. O2 sats 94-95%, HR 114. Dr. Alvino Chapel made aware pt is requesting d/c home

## 2019-04-13 NOTE — ED Notes (Signed)
ED Provider at bedside. 

## 2019-04-13 NOTE — ED Notes (Signed)
Pt placed on room air per VORB from Dr. Alvino Chapel

## 2019-04-13 NOTE — ED Provider Notes (Addendum)
  Physical Exam  BP (!) 180/121   Pulse (!) 107   Temp 98 F (36.7 C) (Oral)   Resp 20   Ht 5\' 8"  (1.727 m)   Wt 111.1 kg   SpO2 99%   BMI 37.25 kg/m   Physical Exam  ED Course/Procedures     Procedures  MDM  Patient has been consistently hypertensive here.  Will give Norvasc to help.  Does not appear to be hypertensive previously.  Also had not been written for albuterol or a diet.  Hospitalist will not write unless they have physically seen him so I have added on more medications.       Davonna Belling, MD 04/13/19 1130  Patient no longer wants to stay in the hospital.  Ambulated without hypoxia but with speaking he is sats will go down to 86 or 87%.  Still wheezing and somewhat dyspneic, however patient is not willing to stay.  Discussed with patient and family member.  Aware of risks.  Will return.  States he will follow up with Dr. Doyle Askew tomorrow.  Discharge home [place].  Will give inhaler and further steroids.    Davonna Belling, MD 04/13/19 1451

## 2019-04-13 NOTE — ED Triage Notes (Signed)
Patient states that he has had SOB and cough with wheezing x 7 days - the patient is congested and is coughing up pglem. The patient has audible wheezing and increased WOB

## 2019-04-13 NOTE — ED Provider Notes (Signed)
[facility] EM[facility] DEPARTMENT Provider Note   CSN: [id number] Arrival date & time: 04/13/19  0100     History   Chief Complaint Chief Complaint  Patient presents with  . Shortness of Breath    HPI Michael Schaefer is a 58 y.o. male.     The history is provided by the patient. The history is limited by the condition of the patient.  Shortness of Breath Severity:  Severe Onset quality:  Sudden Duration:  7 days Timing:  Constant Progression:  Worsening Chronicity:  Recurrent Context comment:  Unknown.   Relieved by:  Nothing Worsened by:  Nothing Ineffective treatments:  Inhaler Associated symptoms: cough and wheezing   Associated symptoms: no abdominal pain and no fever   Wheezing:    Severity:  Severe   Onset quality:  Gradual   Duration:  7 weeks   Timing:  Constant   Progression:  Worsening   Chronicity:  Recurrent Risk factors: no prolonged immobilization   Wheezing Associated symptoms: cough and shortness of breath   Associated symptoms: no fever     Past Medical History:  Diagnosis Date  . Allergic rhinitis   . Cough   . Eosinophilic esophagitis   . MRSA (methicillin resistant staph aureus) culture positive    x 9  . Schatzki's ring     Patient Active Problem List   Diagnosis Date Noted  . Acute asthma exacerbation 04/13/2019  . Asthma exacerbation 09/26/2017  . Eosinophilic esophagitis   . Asthma   . Allergic rhinitis 03/22/2010  . BOILS, RECURRENT 03/22/2010    Past Surgical History:  Procedure Laterality Date  . [place]   right  . NASAL POLYP EXCISION          Home Medications    Prior to Admission medications   Medication Sig Start Date End Date Taking? Authorizing Provider  albuterol (PROVENTIL HFA) 108 (90 BASE) MCG/ACT inhaler Inhale 2 puffs into the lungs every 6 (six) hours as needed.    [provider]  azithromycin (ZITHROMAX) 250 MG tablet Take 1 tablet (250 mg total) by mouth daily.  11/16/17   Collene Gobble, MD  budesonide-formoterol (SYMBICORT) 160-4.5 MCG/ACT inhaler Inhale 2 puffs into the lungs 2 (two) times daily. 10/30/17   Collene Gobble, MD  montelukast (SINGULAIR) 10 MG tablet Take 1 tablet (10 mg total) by mouth at bedtime. 10/30/17   Collene Gobble, MD  predniSONE (DELTASONE) 10 MG tablet Take 1 tablet (10 mg total) by mouth daily with breakfast. Take 4 tabs daily for 3 days, then 3 tabs daily for 3 days, 2 tabs daily for 3 days, then 1 tabs daily for 3 days then stop. 11/16/17   Collene Gobble, MD  predniSONE (DELTASONE) 20 MG tablet Take 2 tablets (40 mg total) by mouth daily with breakfast. 08/29/17   Collene Gobble, MD    Family History Family History  Problem Relation Age of Onset  . COPD Mother   . Uterine cancer Mother   . Asthma Daughter     Social History Social History   Tobacco Use  . Smoking status: Never Smoker  . Smokeless tobacco: Never Used  Substance Use Topics  . Alcohol use: Yes    Comment: occasional wine  . Drug use: No     Allergies   Sulfamethoxazole-trimethoprim   Review of Systems Review of Systems  Constitutional: Negative for fever.  HENT: Negative for congestion.   Eyes: Negative for visual  disturbance.  Respiratory: Positive for cough, shortness of breath and wheezing.   Cardiovascular: Negative for leg swelling.  Gastrointestinal: Negative for abdominal pain.  Genitourinary: Negative for difficulty urinating.  Musculoskeletal: Negative for arthralgias.  Neurological: Negative for dizziness.  Psychiatric/Behavioral: Negative for agitation.  All other systems reviewed and are negative.    Physical Exam Updated Vital Signs BP (!) 159/104   Pulse 87   Temp 98 F (36.7 C) (Oral)   Resp (!) 21   Ht _0  (1.727 m)   Wt 111.1 kg   SpO2 95%   BMI 37.25 kg/m   Physical Exam Vitals signs and nursing note reviewed.  Constitutional:      Appearance: He is not diaphoretic.  HENT:     Head:  Normocephalic and atraumatic.     Nose: Nose normal.  Eyes:     Conjunctiva/sclera: Conjunctivae normal.     Pupils: Pupils are equal, round, and reactive to light.  Neck:     Musculoskeletal: Normal range of motion and neck supple.  Cardiovascular:     Rate and Rhythm: Normal rate and regular rhythm.     Pulses: Normal pulses.     Heart sounds: Normal heart sounds.  Pulmonary:     Effort: Tachypnea, accessory muscle usage and prolonged expiration present.     Breath sounds: Wheezing present.  Abdominal:     General: Abdomen is flat. Bowel sounds are normal.     Tenderness: There is no abdominal tenderness. There is no guarding or rebound.  Musculoskeletal: Normal range of motion.     Right lower leg: No edema.     Left lower leg: No edema.  Skin:    General: Skin is warm and dry.     Capillary Refill: Capillary refill takes less than 2 seconds.  Neurological:     General: No focal deficit present.     Mental Status: He is alert and oriented to person, place, and time.     Deep Tendon Reflexes: Reflexes normal.  Psychiatric:        Mood and Affect: Mood normal.        Behavior: Behavior normal.      ED Treatments / Results  Labs (all labs ordered are listed, but only abnormal results are displayed) Results for orders placed or performed during the hospital encounter of 04/13/19  SARS Coronavirus 2 Ag (30 min TAT) - Nasal Swab (BD Veritor Kit)   Specimen: Nasal Swab (BD Veritor Kit)  Result Value Ref Range   SARS Coronavirus 2 Ag NEGATIVE NEGATIVE  CBC with Differential  Result Value Ref Range   WBC 7.6 4.0 - 10.5 K/uL   RBC 5.22 4.22 - 5.81 MIL/uL   Hemoglobin 15.0 13.0 - 17.0 g/dL   HCT 46.4 39.0 - 52.0 %   MCV 88.9 80.0 - 100.0 fL   MCH 28.7 26.0 - 34.0 pg   MCHC 32.3 30.0 - 36.0 g/dL   RDW 13.6 11.5 - 15.5 %   Platelets 263 150 - 400 K/uL   nRBC 0.0 0.0 - 0.2 %   Neutrophils Relative % 44 %   Neutro Abs 3.4 1.7 - 7.7 K/uL   Lymphocytes Relative 28 %    Lymphs Abs 2.1 0.7 - 4.0 K/uL   Monocytes Relative 11 %   Monocytes Absolute 0.8 0.1 - 1.0 K/uL   Eosinophils Relative 16 %   Eosinophils Absolute 1.2 (H) 0.0 - 0.5 K/uL   Basophils Relative 1 %   Basophils Absolute  0.1 0.0 - 0.1 K/uL   Immature Granulocytes 0 %   Abs Immature Granulocytes 0.01 0.00 - 0.07 K/uL  Brain natriuretic peptide  Result Value Ref Range   B Natriuretic Peptide 116.8 (H) 0.0 - 100.0 pg/mL  Basic metabolic panel  Result Value Ref Range   Sodium 138 135 - 145 mmol/L   Potassium 3.7 3.5 - 5.1 mmol/L   Chloride 105 98 - 111 mmol/L   CO2 24 22 - 32 mmol/L   Glucose, Bld 126 (H) 70 - 99 mg/dL   BUN 12 6 - 20 mg/dL   Creatinine, Ser 1.04 0.61 - 1.24 mg/dL   Calcium 9.2 8.9 - 10.3 mg/dL   GFR calc non Af Amer >60 >60 mL/min   GFR calc Af Amer >60 >60 mL/min   Anion gap 9 5 - 15  Troponin I (High Sensitivity)  Result Value Ref Range   Troponin I (High Sensitivity) 20 (H) <18 ng/L  Troponin I (High Sensitivity)  Result Value Ref Range   Troponin I (High Sensitivity) 19 (H) <18 ng/L   Dg Chest Portable 1 View  Result Date: 04/13/2019 CLINICAL DATA:  Shortness of breath EXAM: PORTABLE CHEST 1 VIEW COMPARISON:  09/26/2017 FINDINGS: No focal opacity or pleural effusion. Normal heart size. No pneumothorax. IMPRESSION: No active disease. Electronically Signed   By: Donavan Foil M.D.   On: 04/13/2019 02:03    EKG  EKG Interpretation  Date/Time:  Sunday April 13 2019 01:14:19 EST Ventricular Rate:  88 PR Interval:    QRS Duration: 100 QT Interval:  366 QTC Calculation: 443 R Axis:   66 Text Interpretation: Sinus rhythm Confirmed by Dory Horn) on 04/13/2019 5:06:55 AM       Radiology Dg Chest Portable 1 View  Result Date: 04/13/2019 CLINICAL DATA:  Shortness of breath EXAM: PORTABLE CHEST 1 VIEW COMPARISON:  09/26/2017 FINDINGS: No focal opacity or pleural effusion. Normal heart size. No pneumothorax. IMPRESSION: No active disease.  Electronically Signed   By: Donavan Foil M.D.   On: 04/13/2019 02:03    Procedures Procedures (including critical care time)  Medications Ordered in ED Medications  albuterol (VENTOLIN HFA) 108 (90 Base) MCG/ACT inhaler 8 puff (8 puffs Inhalation Given 04/13/19 0117)  ipratropium (ATROVENT HFA) inhaler 2 puff (2 puffs Inhalation Given 04/13/19 0117)  dexamethasone (DECADRON) injection 6 mg (6 mg Intravenous Given 04/13/19 0220)  magnesium sulfate IVPB 2 g 50 mL (0 g Intravenous Stopped 04/13/19 0256)  albuterol (VENTOLIN HFA) 108 (90 Base) MCG/ACT inhaler 8 puff (8 puffs Inhalation Given 04/13/19 0316)    Patient with asthma still wheezing and hypoxic with treatment.  Will admit to medicine for continued treatment and care.    Final Clinical Impressions(s) / ED Diagnoses   Final diagnoses:  Wheezing  Hypoxia  Persistent asthma with status asthmaticus, unspecified asthma severity    Admit to Lorenda Peck, Artice Holohan, MD 04/13/19 9842

## 2019-05-29 IMAGING — DX DG CHEST 2V
2 series · 2 of 2 positions shown · non-contrast
Comparison: Chest x-ray of June 21, 2017

CLINICAL DATA: Chronic cough, chest congestion, and shortness of
breath with increasing severity. History of asthma, nonsmoker.

EXAM:
CHEST - 2 VIEW

[chest pa]
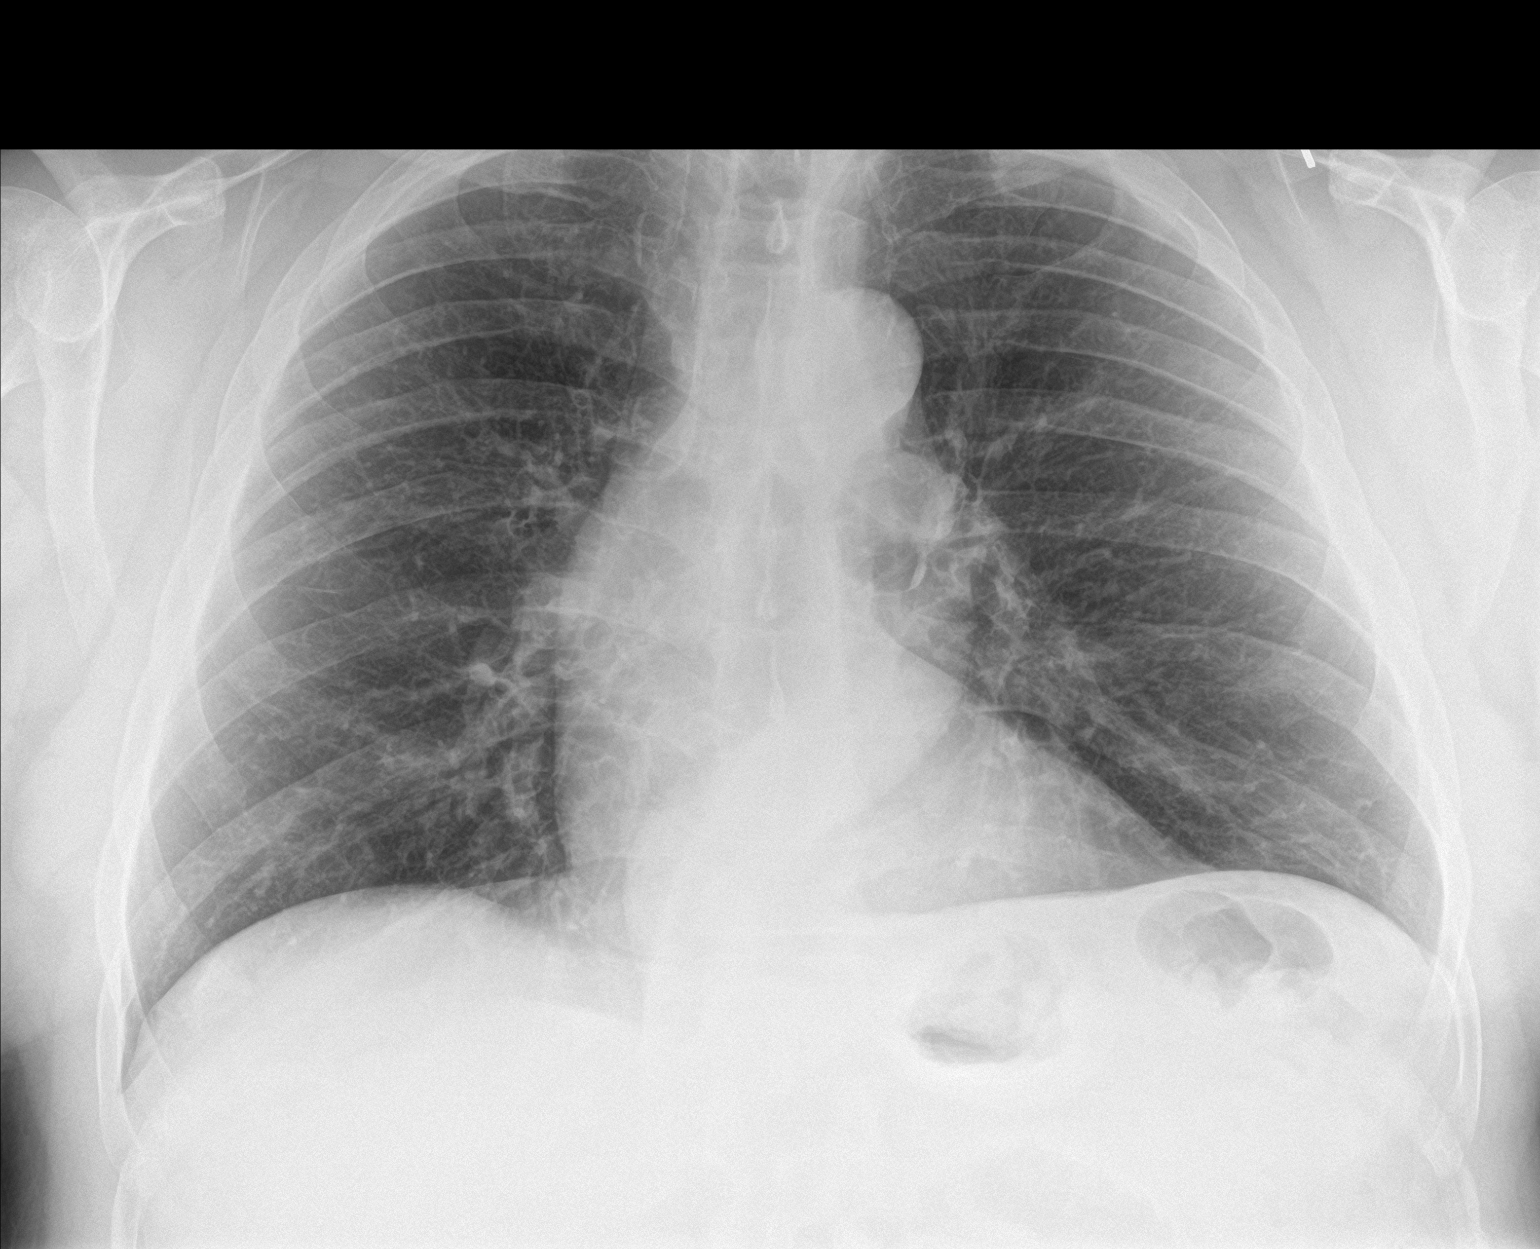

[chest lat]
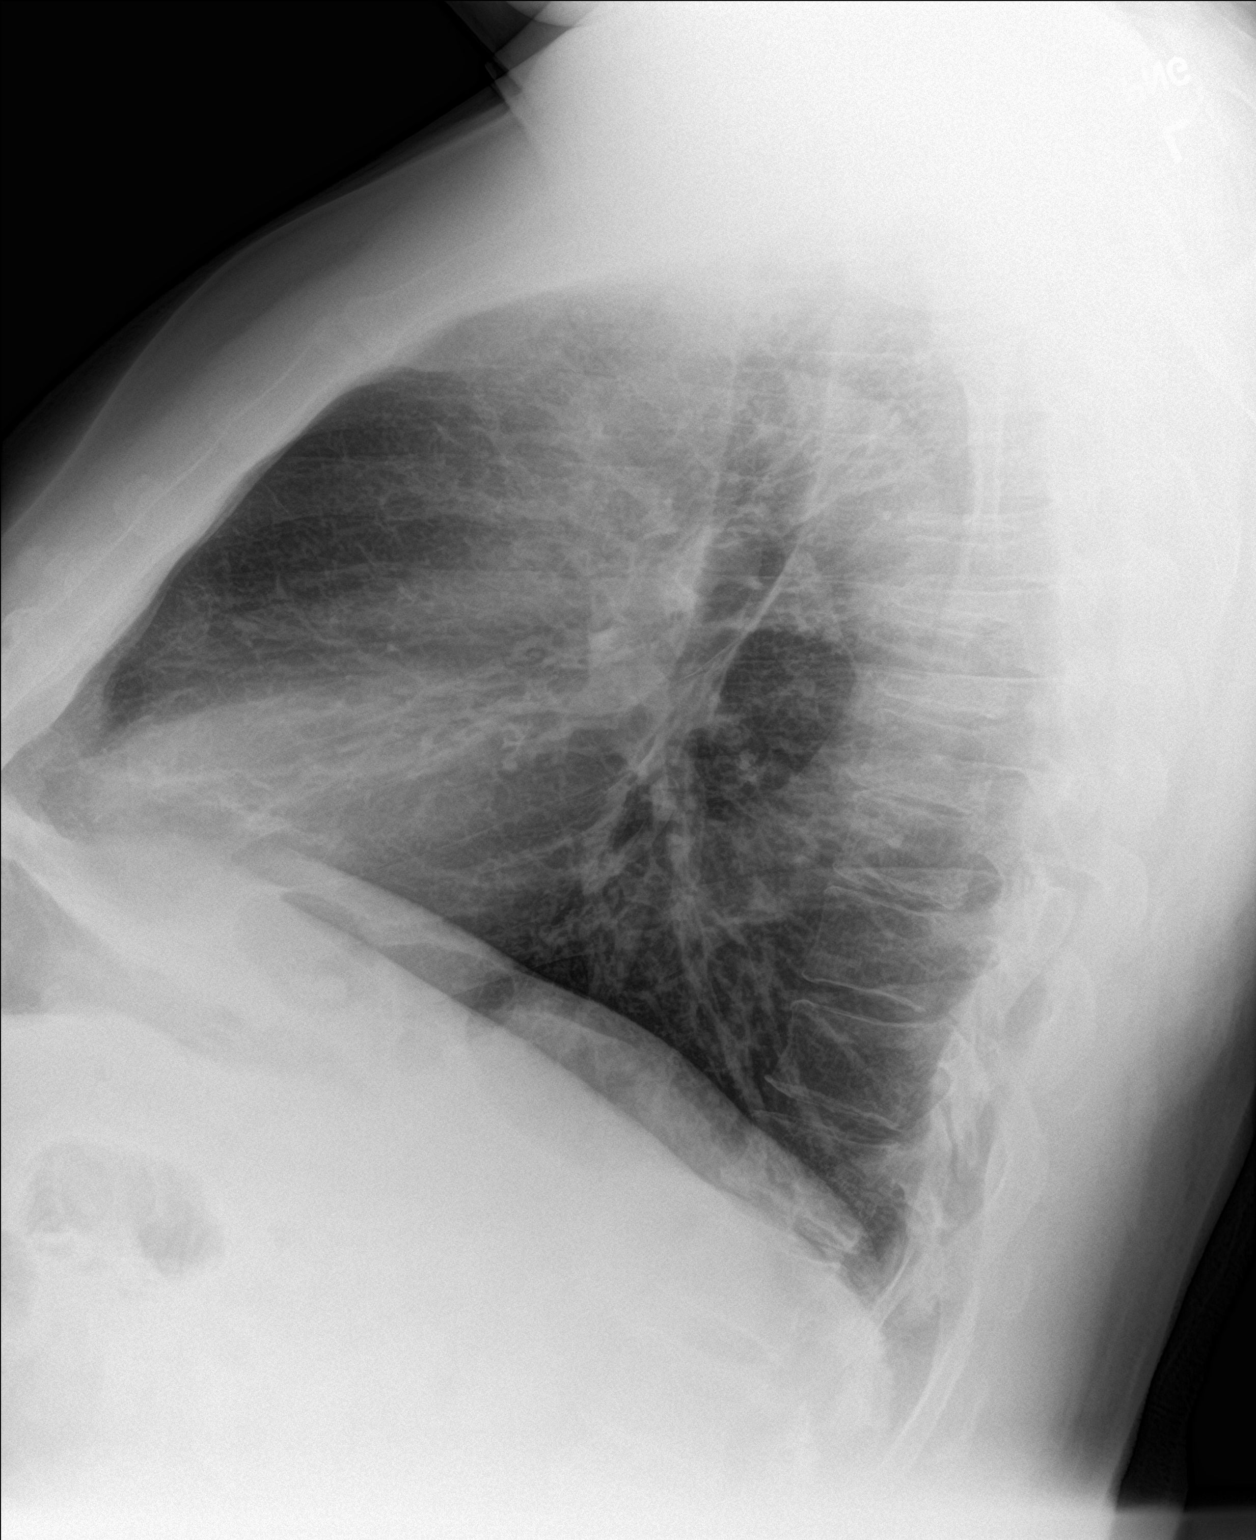

[2 of 2 positions shown; findings below may reference images not displayed]

FINDINGS: The lungs are adequately inflated. There is no focal infiltrate.
There is no pleural effusion. The interstitial markings are coarse.
The heart and pulmonary vascularity are normal. The mediastinum is
normal in width. There is tortuosity of the descending thoracic
aorta. The bony thorax exhibits no acute abnormality.
IMPRESSION: Chronic bronchitic changes.  No alveolar pneumonia nor CHF.

Thoracic aortic atherosclerosis.

## 2019-11-10 ENCOUNTER — Other Ambulatory Visit: Payer: Self-pay

## 2019-11-10 ENCOUNTER — Emergency Department (HOSPITAL_COMMUNITY): Admission: EM | Admit: 2019-11-10 | Discharge: 2019-11-10 | Discharge status: Against Medical Advice | Payer: Self-pay

## 2019-11-10 NOTE — ED Notes (Signed)
Pt called in ED lobby for triage, no answer x2. [employer]

## 2019-11-10 NOTE — ED Notes (Signed)
Pt called in ED lobby for triage, no answer x1. [employer] °
